# Patient Record
Sex: Male | Born: 1945 | Race: White | Hispanic: No | Marital: Married | Smoking: Never smoker
Health system: Southern US, Community
[De-identification: ages and names within clinical notes are randomized; demographics above are authoritative.]

## PROBLEM LIST (undated history)

## (undated) ENCOUNTER — Emergency Department: Admission: EM | Payer: PRIVATE HEALTH INSURANCE

## (undated) DIAGNOSIS — F5101 Primary insomnia: Secondary | ICD-10-CM

## (undated) DIAGNOSIS — E349 Endocrine disorder, unspecified: Secondary | ICD-10-CM

## (undated) DIAGNOSIS — E7800 Pure hypercholesterolemia, unspecified: Secondary | ICD-10-CM

## (undated) DIAGNOSIS — I639 Cerebral infarction, unspecified: Secondary | ICD-10-CM

## (undated) DIAGNOSIS — F419 Anxiety disorder, unspecified: Secondary | ICD-10-CM

## (undated) DIAGNOSIS — I10 Essential (primary) hypertension: Secondary | ICD-10-CM

## (undated) HISTORY — PX: TRIGGER FINGER RELEASE: SHX641

## (undated) HISTORY — PX: COLONOSCOPY: SHX174

## (undated) HISTORY — PX: HERNIA REPAIR: SHX51

## (undated) HISTORY — PX: CATARACT EXTRACTION: SUR2

## (undated) HISTORY — PX: OTHER SURGICAL HISTORY: SHX169

---

## 2013-10-28 ENCOUNTER — Inpatient Hospital Stay: Payer: Self-pay | Admitting: Internal Medicine

## 2013-10-28 LAB — BASIC METABOLIC PANEL
Calcium, Total: 9.1 mg/dL (ref 8.5–10.1)
Chloride: 109 mmol/L — ABNORMAL HIGH (ref 98–107)
Creatinine: 1.3 mg/dL (ref 0.60–1.30)
EGFR (African American): >60
Glucose: 101 mg/dL — ABNORMAL HIGH (ref 65–99)
Osmolality: 288 (ref 275–301)

## 2013-10-28 LAB — CBC
HGB: 15.1 g/dL (ref 13.0–18.0)
MCH: 33.3 pg (ref 26.0–34.0)
MCHC: 35 g/dL (ref 32.0–36.0)
MCV: 95 fL (ref 80–100)
Platelet: 176 10*3/uL (ref 150–440)
RBC: 4.55 10*6/uL (ref 4.40–5.90)
RDW: 13.5 % (ref 11.5–14.5)
WBC: 5.1 10*3/uL (ref 3.8–10.6)

## 2013-10-28 LAB — TROPONIN I: Troponin-I: <0.02 ng/mL

## 2013-10-29 ENCOUNTER — Ambulatory Visit: Payer: Self-pay | Admitting: Neurology

## 2013-10-29 DIAGNOSIS — I6350 Cerebral infarction due to unspecified occlusion or stenosis of unspecified cerebral artery: Secondary | ICD-10-CM

## 2013-10-29 LAB — COMPREHENSIVE METABOLIC PANEL
Albumin: 3.6 g/dL (ref 3.4–5.0)
Alkaline Phosphatase: 50 U/L
Anion Gap: 6 — ABNORMAL LOW (ref 7–16)
BUN: 22 mg/dL — ABNORMAL HIGH (ref 7–18)
Chloride: 110 mmol/L — ABNORMAL HIGH (ref 98–107)
Co2: 26 mmol/L (ref 21–32)
Creatinine: 1.35 mg/dL — ABNORMAL HIGH (ref 0.60–1.30)
EGFR (Non-African Amer.): 54 — ABNORMAL LOW
Glucose: 108 mg/dL — ABNORMAL HIGH (ref 65–99)
Potassium: 3.8 mmol/L (ref 3.5–5.1)
Sodium: 142 mmol/L (ref 136–145)
Total Protein: 6.7 g/dL (ref 6.4–8.2)

## 2013-10-29 LAB — CBC WITH DIFFERENTIAL/PLATELET
Basophil #: 0 10*3/uL (ref 0.0–0.1)
Basophil %: 1 %
Eosinophil #: 0.1 10*3/uL (ref 0.0–0.7)
Eosinophil %: 2.6 %
HCT: 40.3 % (ref 40.0–52.0)
HGB: 14.1 g/dL (ref 13.0–18.0)
Lymphocyte %: 27.9 %
MCHC: 34.9 g/dL (ref 32.0–36.0)
MCV: 95 fL (ref 80–100)
RBC: 4.26 10*6/uL — ABNORMAL LOW (ref 4.40–5.90)
RDW: 13.4 % (ref 11.5–14.5)
WBC: 4.9 10*3/uL (ref 3.8–10.6)

## 2013-10-29 LAB — TSH: Thyroid Stimulating Horm: 1.86 u[IU]/mL

## 2013-10-29 LAB — LIPID PANEL
Cholesterol: 135 mg/dL (ref 0–200)
HDL Cholesterol: 44 mg/dL (ref 40–60)
Ldl Cholesterol, Calc: 63 mg/dL (ref 0–100)
Triglycerides: 141 mg/dL (ref 0–200)
VLDL Cholesterol, Calc: 28 mg/dL (ref 5–40)

## 2013-10-29 LAB — MAGNESIUM: Magnesium: 2.2 mg/dL

## 2015-03-01 NOTE — H&P (Signed)
**Note Darrell-Identified via Obfuscation** PATIENT NAME:  Darrell Chandler, LESSLEY MR#:  [id number] DATE OF BIRTH:  [birth year]  DATE OF ADMISSION:  10/28/2013  PRIMARY CARE PHYSICIAN: In [place]. He is from [place], [place].  REFERRING PHYSICIAN: Dr. Governor Rooks  CHIEF COMPLAINT: Weakness of left upper extremity.   HISTORY OF PRESENT ILLNESS: This is a very nice, 69 year old gentleman, with history of hypertension, PVCs, palpitations and hyperlipidemia, who comes with weakness of the left upper extremity. The patient woke up at 7:30 a.m. this morning, and he felt weak and numb on his left arm from the shoulder down into the tip of his fingers. Apparently the patient has had issues in the past with neck pain, for what he has been put on Mobic, and he had radial neuropathy, but the symptoms feel completely different to him today. At 9:00 a.m. he went to a baseball game for his grandson, and at that moment he was not able to hold anything with his left hand. The problem continued to worsen up until he arrived to the Emergency Department. In the Emergency Department, the patient was evaluated, and he was admitted for possible TIA versus CVA. After evaluation was completed, the patient also had an MRI of the brain, showing actually acute nonhemorrhagic infarcts involving the posterior right frontal and right parietal cortex, with remote lacunar infarcts in the territory of the mid cerebral artery. Based on my assessment, with his history of PVCs and palpitations and multiple strokes, this could be something related to atrial fibrillation, for what we are going to keep him on telemetry monitoring.   The patient is admitted for treatment and evaluation of this condition. At this moment, he is hemodynamically stable and his symptoms actually have improved, but he still feels significant weakness of his left upper extremity. He has been able to ambulate and not having any balance problems.   REVIEW OF SYSTEMS:   CONSTITUTIONAL: No fever. No fatigue.  EYES: No  blurry vision, double vision.  ENT: No difficulty swallowing. No tinnitus.  RESPIRATORY: No cough or shortness of breath.  CARDIOVASCULAR: No chest pain or edema, since being diagnosed with PVCs, put on a beta blocker, and he also has occasional palpitations.  GASTROINTESTINAL: No nausea, vomiting, abdominal pain, constipation or diarrhea.  GENITOURINARY: No dysuria or hematuria. No prostatitis.  ENDOCRINE: No polyuria, polydipsia, polyphagia, cold or heat intolerance.  HEMATOLOGIC AND LYMPHATIC: No anemia, easy bruising or bleeding.  MUSCULOSKELETAL: No significant joint effusions or gout.  NEUROLOGIC: No numbness, tingling prior to today. Now left upper extremity numbness and weakness. No previous CVAs that he is aware of, although his CT scan shows lacunar infarcts. No previous TIA. No headache.  PSYCHIATRIC: No significant agitation. Positive mild depression. Mild anxiety, as he deals with a lot of problems with his wife who is bipolar, has had  previous suicide attempts, and she is an alcoholic. They have been married for over 45 years.   PAST MEDICAL HISTORY: 1.  Hypertension.  2.  PVCs.  3.  Hyperlipidemia.  The patient has had a stress test within the past couple of months and it was normal.   ALLERGIES: No known drug allergies.   PAST SURGICAL HISTORY: He had knee arthroscopy twice on the right knee.   FAMILY HISTORY: Positive for CVA in both parents. They both died from strokes. No MI and no cancer in the family.   SOCIAL HISTORY: He has never smoked. He drinks one glass of wine a day, no more than that. He never gets drunk.  He is retired, and he is dealing with a lot of stress with his wife due to alcoholism.   CURRENT MEDICATIONS: Include Zantac 75 mg twice daily, Xanax 1 mg or half tablet as needed for anxiety and at night to go to sleep, simvastatin 40 mg once daily, metoprolol 25 mg once daily, meloxicam 50 mg once daily, clonazepam 1 mg once a day at bedtime, amlodipine 5  mg once a day, and Ambien 5 mg once a day.   PHYSICAL EXAMINATION: VITAL SIGNS: Blood pressure 157/50, respirations 18, pulse 74, temperature 98.1, pulse oxygenation 94% at rest.  GENERAL: The patient is alert, oriented x 3, no acute distress. No respiratory distress. Hemodynamically stable.  HEENT: Pupils are equal and reactive. Extraocular movements intact. Mucosa are moist. Anicteric sclerae. Pink conjunctivae. No oral lesions.  NEUROLOGIC: Cranial nerves II through XII intact. Ocular movements intact. Pupils are accommodation and reaction to light is normal. Tongue is central. Uvula is central. Strength is equal in 4 extremities. The patient has 5/5 strength, but he feels like he left arm is heavier.  Sensation is decreased on the left upper extremity from the shoulder down to the tip of the fingers. His strength of both lower extremities is normal. DTRs +2 all over his 4 extremities.  NECK: Supple. No JVD. No thyromegaly. No adenopathy. No carotid bruits. Normal range of motion. No signs of radiculopathy or mobilization of the neck.  CARDIOVASCULAR: Regular rate and rhythm. No murmurs, rubs or gallops are appreciated. No displacement of PMI.  LUNGS: Clear, without any wheezing or crepitus. No use of accessory muscles.  ABDOMEN: Soft, nontender, nondistended. No hepatosplenomegaly. No masses. Bowel sounds are positive.  GENITAL: Exam deferred.  EXTREMITIES: No edema, cyanosis or clubbing.  VASCULAR: Pulses +2. Capillary refill less than 3. SKIN: No rashes or petechiae.  MUSCULOSKELETAL: Normal joints, without any signs of effusions. Normal range of motion.  LYMPHATIC: Negative for lymphadenopathy in the neck or supraclavicular areas.  PSYCHIATRIC: Alert, oriented x 3, good affect.   RESULTS:  MRI of the brain shows acute nonhemorrhagic infarcts involving the posterior right frontal and right parietal cortex, corresponding with left hand symptoms. He has also remote lacunar infarcts in the  basal ganglia bilaterally, and atrophy with chronic microvascular ischemia.   CT scan of the brain:  Old lacunar infarcts. No acute findings. Glucose 101, BUN 20, chloride 109, potassium 3.7. Troponin is negative. White count is 5.1, hemoglobin 15, platelet count 176.  EKG: No ST depression or elevation. No AFib.    ASSESSMENT AND PLAN: This is a very nice, 69 year old gentleman, with history of hypertension, PVCs, and hyperlipidemia, who comes with left upper extremity numbness.  Admitted for treatment of stroke.   1. Cerebrovascular accident. The patient is admitted, put on aspirin, give him atorvastatin instead of simvastatin today at 20 mg. Check lipids. Deep vein thrombosis prophylaxis with heparin at low dose. This is likely to be a stroke secondary to atrial fibrillation. The patient may have telemetry monitoring. I recommend to put on Holter monitoring once he is discharged. Other possibilities: Carotid artery disease, for what he is going to have an ultrasound of the carotid arteries. We are also going to get an echocardiogram to evaluate the possibility of a thromboembolic event. Neuro checks. Neurology reevaluation in the morning. Physical Therapy, Occupational Therapy, and Speech Therapy consulted. The patient is able to swallow at this moment, for what his diet is going to be regular.   2. Hypertension. Holding blood pressure medications  and allow permissive hypertension to give good circulation to the ischemic areas. Treat blood pressures systolics only if they are above 200, diastolics only above 110.   3.  Elevation of BUN and creatinine. This could be secondary to slight dehydration. Recommend just normal amount of water drinking. I don't have a previous creatinine to compare. Recheck in the morning.   4.  Hyperlipidemia. Now on atorvastatin, as it is more powerful than simvastatin. Check a lipid profile in the morning.   5. Check other risk factors, as hemoglobin A1c and TSH.   6.   For his alcohol consumption, at this moment I think it is okay just to observe him. He only drinks once a night, one single drink. If any signs of DTs, consider CIWA protocol.   7.  Gastrointestinal prophylaxis with proton pump inhibitor, deep vein thrombosis prophylaxis with heparin.  I spent about 50 minutes with this patient.      ____________________________ Felipa Furnace, MD rsg:mr D: 10/28/2013 22:09:32 ET T: 10/28/2013 22:35:59 ET JOB#: [id number]  cc: Felipa Furnace, MD, <Dictator> Brianna Esson Juanda Chance MD ELECTRONICALLY SIGNED 10/30/2013 15:02

## 2015-03-02 NOTE — Discharge Summary (Signed)
**Note Darrell-Identified via Obfuscation** PATIENT NAME:  Darrell Chandler, Darrell Chandler MR#:  [id number] DATE OF BIRTH:  11-13-1945  DATE OF ADMISSION:  10/28/2013 DATE OF DISCHARGE:  10/30/2013  ADMITTING PHYSICIAN: Darrell Sink, MD  DISCHARGING PHYSICIAN: Darrell Lighter, MD  PRIMARY CARE PHYSICIAN: Darrell Chandler, in [place].  CONSULTATIONS IN THE HOSPITAL: Neurology by Darrell Chandler.   DISCHARGE DIAGNOSES: 1.  Acute cerebral vascular accident with posterior right frontal and parietal infarcts with left hand numbness which is improving.  2.  History of palpitations and premature ventricular complexes. No atrial fibrillation noted on telemetry.  3.  Hypertension.  4.  Hyperlipidemia.  5.  Cervical radiculopathy.  DISCHARGE HOME MEDICATIONS:  1.  Meloxicam 15 mg p.o. daily. 2.  Xanax 0.5 mg p.r.n. for anxiety.  3.  Clonazepam 1 mg daily at bedtime.  4.  Ambien 5 mg at bedtime.  5.  Zantac 75 mg 4 tablets once a day.  6.  Zyrtec 10 mg p.o. daily.  7.  Simvastatin 40 mg p.o. daily.  8.  Norvasc 2.5 mg p.o. daily.  9.  Metoprolol 25 mg at bedtime.  10.  Aggrenox 1 capsule p.o. b.i.d.   DISCHARGE DIET: Low sodium.  DISCHARGE ACTIVITY: As tolerated.  FOLLOWUP INSTRUCTIONS: 1.  Cardiology follow-up in 2 weeks for loop recorder to see if atrial fibrillation can be diagnosed or ruled out, especially in light of the new CVA. 2.  Also check with the cardiologist to see if TEE can be done to rule out patent foramen ovale or any cardiac source of emboli as TTE was normal. 3.  Primary care physician followup in 1 to 2 weeks.  LABS AND IMAGING STUDIES PRIOR TO DISCHARGE: WBC 4.9, hemoglobin 14.1, hematocrit 40.3, and platelet count 166.   Sodium 142, potassium 3.8, chloride 110, bicarb 26, BUN 22, creatinine 1.35, glucose 108, and calcium of 8.9.   ALT 25, AST 31, alk phos 50, total bili 0.6, and albumin 3.6. Magnesium 2.2. HbA1c is 5.3. 4.  LDL cholesterol 63, HDL 44, total cholesterol is 135, and triglycerides 141. TSH within normal  limits at 1.86.  MRI of brain showing acute nonhemorrhagic infarction involving the posterior right frontal and right parietal cortex. Remote lacunar infarcts of the basal ganglia seen. Atrophy and advanced white matter disease likely representing chronic microvascular ischemia.   Carotid Dopplers bilaterally showing no significant stenosis.   Transthoracic echocardiogram revealing EF of 60% to 65%. Normal global LV systolic function.  Cardiac enzymes are negative.   CT of the head showing no evidence of any acute intracranial abnormality, chronic left thalamic infarct seen.   BRIEF HOSPITAL COURSE: Darrell Chandler is a 69 year old very pleasant Caucasian gentleman with past medical history significant for hypertension, hyperlipidemia, and history of PVCs with no diagnosis of A-fib or flutter who is visiting his family from [place] here for Christmas who presents to the hospital secondary to sudden onset of left hand numbness and weakness secondary to loss of sensation.  1.  Acute CVA. The patient does have history of cervical radiculopathy with left hand symptoms, but mostly involving the ulnar distribution. However, this numbness was different so the patient presented to the hospital and MRI did reveal that he had right-sided infarcts which are consistent with the left right-sided symptoms. The patient's paresthesias have improved significantly throughout the hospital course. He was on aspirin at home. That was changed over to Aggrenox. He is already taking statin and his cholesterol was within normal limits. He had a transthoracic echo which did not show  any cardiac source of emboli. He does have known history of PVCs, but he was on the monitor for almost 48 hours which does not show any known diagnosis of A-fib. He was seen by neurology who recommended he will need a TEE and loop recorder for diagnosis of A-fib or finding out other cardiac source of emboli. However, the patient prefers to have this done  as an outpatient because he will be flying back to [place] in 2 days and has his primary cardiologist over there. Checked with neurologist, Darrell Chandler, and also cardiologist, Darrell Chandler, who are agreeable and the risk of recurrent stroke in the meanwhile would be extremely less, although still possible. The patient worked with physical therapy who recommended that he will not be needing any home health or rehab as he is doing extremely well at this time.   All his other home medications were continued without any changes.   DISCHARGE CONDITION: Stable.   DISCHARGE DISPOSITION: Home.   TIME SPENT ON DISCHARGE: 40 minutes.  ____________________________ Darrell Lighter, MD rk:sb D: 10/30/2013 14:38:52 ET T: 10/30/2013 16:17:30 ET JOB#: [id number]  cc: Darrell Lighter, MD, <Dictator> Darrell Lighter MD ELECTRONICALLY SIGNED 11/15/2013 7:20

## 2015-03-02 NOTE — Consult Note (Signed)
PATIENT NAME:  Darrell Chandler, Darrell Chandler MR#:  [id number] DATE OF BIRTH:  September 06, 1946  DATE OF CONSULTATION:  10/29/2013  REFERRING PHYSICIAN:  Internal medicine hospitalist CONSULTING PHYSICIAN:  Weston Settle, MD  REASON FOR CONSULTATION:  Stroke.   HISTORY OF PRESENT ILLNESS: The patient is a 69 year old white male who has moved here from [place]. This morning, he got up and was noticing after he woke up that he had some left hand weakness and numbness and that he could not hold onto things or use his left hand well. There was no left face or leg involvement. Upon arrival, he had a CT scan of the brain which I reviewed personally.  It indicates no acute hemorrhages. There is evidence of an old left thalamic infarct. Subsequent MRI of the brain was performed which I reviewed personally. It indicates 2 acute ischemic infarcts. The first one is in the right frontal motor cortex inferiorly and second one is in the right parietal somatosensory cortex. There is an old left thalamic infarct. There is an old right cerebellar infarct as well. Carotid Doppler ultrasound is normal. Transthoracic echocardiogram is pending at this time. Telemetry shows essentially normal sinus rhythm with very frequent PVCs. This is something that he says that he has been told he has in the past and had a long Holter monitoring in [place] without any evidence of atrial fibrillation that they could ascertain at that time. He had not been taking any antiplatelets before he came in and was put on aspirin here. CBC and complete metabolic panel are normal. LDL 63, triglycerides are 284. Hemoglobin A1c is 5.3. Troponin I is negative. TSH is 1.86.   PAST MEDICAL HISTORY.  1.  Premature ventricular contractions.  2.  Ischemic stroke.  3.  Hypertension.  4.  Hyperlipidemia.   PAST SURGICAL HISTORY: Knee arthroscopy twice in the right knee.   CURRENT MEDICATIONS IN THE HOSPITAL: 1.  Aspirin 325 mg daily.  2.  Lipitor 20 mg daily.  3.   Clonazepam 1 mg daily.  4.  Heparin 5000 units q.12 hours.   ALLERGIES:  No known drug allergies.   SOCIAL HISTORY: He does not smoke or use any illicit drugs and he is a social alcohol drinker once a day.   FAMILY HISTORY: Positive for stroke in both parents.   REVIEW OF SYSTEMS: Reveals no fever, no meningismus, no diplopia, no dysphagia, no chest pain or shortness of breath and all other review of systems are negative.   PHYSICAL EXAMINATION:  VITAL SIGNS:  Blood pressure is 136/81, pulse of 76, temperature 97.8.  HEART: Regular rate and rhythm S1, S2, no murmurs.  LUNGS: Are clear.  NECK:  There are no carotid bruits.  NEUROLOGICAL EXAMINATION:  He is awake and alert. Language is fluent. Comprehension, naming and repetition are intact. Pupils are equal and reactive. Extraocular movements are intact. Face is symmetrical. Tongue is midline. Strength testing reveals about 4 out of 5 strength in the left finger abduction and grip, and about 4 out of 5 strength in wrist extension on the left. Everywhere else is 5 out of 5 in strength. Coordination with finger-to-nose and heel-to-shin is intact and symmetrical. Sensory testing reveals slightly reduced pinprick sensation in the left hand. Gait testing was deferred at this time.   IMPRESSION AND PLAN: This patient has suffered 2 ischemic infarcts causing mainly left hand weakness and numbness. Because of the 2 isolated infarcts acutely, I suspect this is probably cardiac embolism. Atrial fibrillation, unfortunately, has never  been fully officially documented in this patient despite Holter monitoring in the past. He may require an implanted loop recorder which can stay for up to 3 years to assess for that. This will be important since if we do confirm that, then anticoagulation is the treatment of choice to reduce future stroke risk. For now, we can continue aspirin and continue telemetry monitoring. If the transthoracic echocardiogram does not show  intracardiac thrombus or any other source of possible intracardiac thrombus, then I do recommend doing a transesophageal echocardiogram as well to look for a patent foramen ovale with atrioseptal aneurysm as well as possible atrial myxoma as well as atrial appendage thrombus or any other intracardiac source as well as aortic arch atheroma which could be a source of embolism. Further management will depend on the results of these studies.   ____________________________ Weston SettleShervin Lilia Letterman, MD se:cs D: 10/29/2013 12:35:15 ET T: 10/29/2013 15:48:07 ET JOB#: [id number]  cc: Weston SettleShervin Marilyne Haseley, MD, <Dictator> Weston SettleSHERVIN Jaysean Manville MD ELECTRONICALLY SIGNED 12/02/2013 14:42

## 2017-01-06 DIAGNOSIS — M1991 Primary osteoarthritis, unspecified site: Secondary | ICD-10-CM | POA: Insufficient documentation

## 2017-01-06 DIAGNOSIS — F5101 Primary insomnia: Secondary | ICD-10-CM | POA: Insufficient documentation

## 2017-01-06 DIAGNOSIS — N183 Chronic kidney disease, stage 3 unspecified: Secondary | ICD-10-CM | POA: Insufficient documentation

## 2017-01-06 DIAGNOSIS — Z8673 Personal history of transient ischemic attack (TIA), and cerebral infarction without residual deficits: Secondary | ICD-10-CM | POA: Insufficient documentation

## 2017-01-06 DIAGNOSIS — Z0000 Encounter for general adult medical examination without abnormal findings: Secondary | ICD-10-CM | POA: Insufficient documentation

## 2017-01-06 DIAGNOSIS — I129 Hypertensive chronic kidney disease with stage 1 through stage 4 chronic kidney disease, or unspecified chronic kidney disease: Secondary | ICD-10-CM | POA: Insufficient documentation

## 2017-01-06 DIAGNOSIS — E7800 Pure hypercholesterolemia, unspecified: Secondary | ICD-10-CM | POA: Insufficient documentation

## 2017-01-06 DIAGNOSIS — F411 Generalized anxiety disorder: Secondary | ICD-10-CM | POA: Insufficient documentation

## 2017-07-14 DIAGNOSIS — I493 Ventricular premature depolarization: Secondary | ICD-10-CM | POA: Insufficient documentation

## 2017-11-16 ENCOUNTER — Other Ambulatory Visit: Payer: Self-pay | Admitting: Neurology

## 2017-11-16 DIAGNOSIS — I639 Cerebral infarction, unspecified: Secondary | ICD-10-CM

## 2017-11-17 ENCOUNTER — Ambulatory Visit
Admission: RE | Admit: 2017-11-17 | Discharge: 2017-11-17 | Disposition: A | Discharge status: Home / Self Care | Payer: Medicare Other | Source: Ambulatory Visit | Attending: Neurology | Admitting: Neurology

## 2017-11-17 DIAGNOSIS — Z9189 Other specified personal risk factors, not elsewhere classified: Secondary | ICD-10-CM | POA: Diagnosis not present

## 2017-11-17 DIAGNOSIS — I6782 Cerebral ischemia: Secondary | ICD-10-CM | POA: Insufficient documentation

## 2017-11-17 DIAGNOSIS — I639 Cerebral infarction, unspecified: Secondary | ICD-10-CM | POA: Diagnosis present

## 2017-11-29 DIAGNOSIS — E538 Deficiency of other specified B group vitamins: Secondary | ICD-10-CM | POA: Insufficient documentation

## 2018-01-06 DIAGNOSIS — M545 Low back pain, unspecified: Secondary | ICD-10-CM | POA: Insufficient documentation

## 2018-01-06 DIAGNOSIS — G8929 Other chronic pain: Secondary | ICD-10-CM | POA: Insufficient documentation

## 2018-05-23 DIAGNOSIS — R42 Dizziness and giddiness: Secondary | ICD-10-CM | POA: Insufficient documentation

## 2019-06-02 ENCOUNTER — Inpatient Hospital Stay: Admission: RE | Admit: 2019-06-02 | Payer: PRIVATE HEALTH INSURANCE | Source: Ambulatory Visit

## 2019-06-02 ENCOUNTER — Other Ambulatory Visit: Payer: Self-pay

## 2019-06-02 ENCOUNTER — Other Ambulatory Visit
Admission: RE | Admit: 2019-06-02 | Discharge: 2019-06-02 | Disposition: A | Discharge status: Home / Self Care | Payer: Medicare Other | Source: Ambulatory Visit | Attending: Internal Medicine | Admitting: Internal Medicine

## 2019-06-02 DIAGNOSIS — Z1159 Encounter for screening for other viral diseases: Secondary | ICD-10-CM | POA: Insufficient documentation

## 2019-06-03 LAB — SARS CORONAVIRUS 2 (TAT 6-24 HRS): SARS Coronavirus 2: NEGATIVE

## 2019-06-06 ENCOUNTER — Ambulatory Visit: Payer: Medicare Other | Admitting: Anesthesiology

## 2019-06-06 ENCOUNTER — Other Ambulatory Visit: Payer: Self-pay

## 2019-06-06 ENCOUNTER — Ambulatory Visit
Admission: RE | Admit: 2019-06-06 | Discharge: 2019-06-06 | Disposition: A | Discharge status: Home / Self Care | Payer: Medicare Other | Attending: Internal Medicine | Admitting: Internal Medicine

## 2019-06-06 ENCOUNTER — Encounter
Admission: RE | Disposition: A | Discharge status: Home / Self Care | Payer: Self-pay | Source: Home / Self Care | Attending: Internal Medicine

## 2019-06-06 DIAGNOSIS — Z79899 Other long term (current) drug therapy: Secondary | ICD-10-CM | POA: Insufficient documentation

## 2019-06-06 DIAGNOSIS — R1312 Dysphagia, oropharyngeal phase: Secondary | ICD-10-CM | POA: Insufficient documentation

## 2019-06-06 DIAGNOSIS — J45909 Unspecified asthma, uncomplicated: Secondary | ICD-10-CM | POA: Diagnosis not present

## 2019-06-06 DIAGNOSIS — K449 Diaphragmatic hernia without obstruction or gangrene: Secondary | ICD-10-CM | POA: Diagnosis not present

## 2019-06-06 DIAGNOSIS — F5101 Primary insomnia: Secondary | ICD-10-CM | POA: Insufficient documentation

## 2019-06-06 DIAGNOSIS — R1310 Dysphagia, unspecified: Secondary | ICD-10-CM | POA: Diagnosis present

## 2019-06-06 DIAGNOSIS — Z7982 Long term (current) use of aspirin: Secondary | ICD-10-CM | POA: Insufficient documentation

## 2019-06-06 DIAGNOSIS — E291 Testicular hypofunction: Secondary | ICD-10-CM | POA: Insufficient documentation

## 2019-06-06 DIAGNOSIS — K222 Esophageal obstruction: Secondary | ICD-10-CM | POA: Insufficient documentation

## 2019-06-06 DIAGNOSIS — F419 Anxiety disorder, unspecified: Secondary | ICD-10-CM | POA: Insufficient documentation

## 2019-06-06 DIAGNOSIS — I10 Essential (primary) hypertension: Secondary | ICD-10-CM | POA: Diagnosis not present

## 2019-06-06 DIAGNOSIS — K209 Esophagitis, unspecified: Secondary | ICD-10-CM | POA: Insufficient documentation

## 2019-06-06 DIAGNOSIS — K228 Other specified diseases of esophagus: Secondary | ICD-10-CM | POA: Diagnosis not present

## 2019-06-06 DIAGNOSIS — E7800 Pure hypercholesterolemia, unspecified: Secondary | ICD-10-CM | POA: Diagnosis not present

## 2019-06-06 DIAGNOSIS — Z8673 Personal history of transient ischemic attack (TIA), and cerebral infarction without residual deficits: Secondary | ICD-10-CM | POA: Insufficient documentation

## 2019-06-06 HISTORY — DX: Pure hypercholesterolemia, unspecified: E78.00

## 2019-06-06 HISTORY — DX: Endocrine disorder, unspecified: E34.9

## 2019-06-06 HISTORY — PX: ESOPHAGOGASTRODUODENOSCOPY: SHX5428

## 2019-06-06 HISTORY — DX: Essential (primary) hypertension: I10

## 2019-06-06 HISTORY — DX: Cerebral infarction, unspecified: I63.9

## 2019-06-06 HISTORY — DX: Anxiety disorder, unspecified: F41.9

## 2019-06-06 HISTORY — DX: Primary insomnia: F51.01

## 2019-06-06 SURGERY — EGD (ESOPHAGOGASTRODUODENOSCOPY)
Anesthesia: General

## 2019-06-06 MED ORDER — SODIUM CHLORIDE 0.9 % IV SOLN
INTRAVENOUS | Status: DC
  Administered 2019-06-06: 10:00:00 via INTRAVENOUS

## 2019-06-06 MED ORDER — LIDOCAINE HCL (CARDIAC) PF 100 MG/5ML IV SOSY
PREFILLED_SYRINGE | INTRAVENOUS | Status: DC | PRN
  Administered 2019-06-06: 100 mg via INTRAVENOUS

## 2019-06-06 MED ORDER — PROPOFOL 10 MG/ML IV BOLUS
INTRAVENOUS | Status: DC | PRN
  Administered 2019-06-06: 80 mg via INTRAVENOUS

## 2019-06-06 MED ORDER — PROPOFOL 500 MG/50ML IV EMUL
INTRAVENOUS | Status: DC | PRN
  Administered 2019-06-06: 180 ug/kg/min via INTRAVENOUS

## 2019-06-06 MED ORDER — LIDOCAINE HCL (PF) 2 % IJ SOLN
INTRAMUSCULAR | Status: AC
  Filled 2019-06-06: qty 10

## 2019-06-06 MED ORDER — PROPOFOL 500 MG/50ML IV EMUL
INTRAVENOUS | Status: AC
  Filled 2019-06-06: qty 50

## 2019-06-06 NOTE — Transfer of Care (Signed)
Immediate Anesthesia Transfer of Care Note  Patient: Darrell Chandler  Procedure(s) Performed: ESOPHAGOGASTRODUODENOSCOPY (EGD) (N/A )  Patient Location: PACU  Anesthesia Type:General  Level of Consciousness: sedated  Airway & Oxygen Therapy: Patient Spontanous Breathing and Patient connected to nasal cannula oxygen  Post-op Assessment: Report given to RN and Post -op Vital signs reviewed and stable  Post vital signs: Reviewed and stable  Last Vitals:  Vitals Value Taken Time  BP 113/63 06/06/19 1121  Temp 36.8 C 06/06/19 1121  Pulse 60 06/06/19 1122  Resp 19 06/06/19 1122  SpO2 97 % 06/06/19 1122  Vitals shown include unvalidated device data.  Last Pain:  Vitals:   06/06/19 1121  TempSrc: Tympanic  PainSc: Asleep         Complications: No apparent anesthesia complications

## 2019-06-06 NOTE — Anesthesia Preprocedure Evaluation (Signed)
Anesthesia Evaluation  Patient identified by MRN, date of birth, ID band Patient awake    Reviewed: Allergy & Precautions, NPO status , Patient's Chart, lab work & pertinent test results, reviewed documented beta blocker date and time   History of Anesthesia Complications Negative for: history of anesthetic complications  Airway Mallampati: II       Dental   Pulmonary asthma , neg sleep apnea, neg COPD,           Cardiovascular hypertension, Pt. on medications and Pt. on home beta blockers (-) Past MI and (-) CHF (-) dysrhythmias (-) Valvular Problems/Murmurs     Neuro/Psych neg Seizures Anxiety CVA (L hand weakness, resolved), No Residual Symptoms    GI/Hepatic Neg liver ROS, GERD  Medicated,  Endo/Other  neg diabetes  Renal/GU negative Renal ROS     Musculoskeletal   Abdominal   Peds  Hematology   Anesthesia Other Findings   Reproductive/Obstetrics                             Anesthesia Physical Anesthesia Plan  ASA: III  Anesthesia Plan: General   Post-op Pain Management:    Induction: Intravenous  PONV Risk Score and Plan: 2 and TIVA and Propofol infusion  Airway Management Planned: Nasal Cannula  Additional Equipment:   Intra-op Plan:   Post-operative Plan:   Informed Consent: I have reviewed the patients History and Physical, chart, labs and discussed the procedure including the risks, benefits and alternatives for the proposed anesthesia with the patient or authorized representative who has indicated his/her understanding and acceptance.       Plan Discussed with:   Anesthesia Plan Comments:         Anesthesia Quick Evaluation

## 2019-06-06 NOTE — Interval H&P Note (Signed)
History and Physical Interval Note:  06/06/2019 11:04 AM  Darrell Chandler  has presented today for surgery, with the diagnosis of Dysphagia.  The various methods of treatment have been discussed with the patient and family. After consideration of risks, benefits and other options for treatment, the patient has consented to  Procedure(s): ESOPHAGOGASTRODUODENOSCOPY (EGD) (N/A) as a surgical intervention.  The patient's history has been reviewed, patient examined, no change in status, stable for surgery.  I have reviewed the patient's chart and labs.  Questions were answered to the patient's satisfaction.     [place], [place]

## 2019-06-06 NOTE — H&P (Signed)
Outpatient short stay form Pre-procedure 06/06/2019 11:02 AM Darrell Chandler, M.D.  Primary Physician:  [name], M.D.  Reason for visit:  Dysphagia  History of present illness:  Oropharyngeal dyshpagia to solid, dry foods. Some GERD symptoms on Protonix. Mild postprandial abdominal pain. No hemetemesis or melena. No weight loss. Takes aggrenox. Held today.     Current Facility-Administered Medications:  .  0.9 %  sodium chloride infusion, , Intravenous, Continuous, [place], Benay Pike, MD, Last Rate: 20 mL/hr at 06/06/19 1012  Medications Prior to Admission  Medication Sig Dispense Refill Last Dose  . amLODipine (NORVASC) 5 MG tablet Take 5 mg by mouth daily.   06/05/2019 at Unknown time  . atorvastatin (LIPITOR) 40 MG tablet Take 40 mg by mouth daily.   06/05/2019 at Unknown time  . CHOLECALCIFEROL PO Take 5,000 Units by mouth daily.   06/05/2019 at Unknown time  . clonazePAM (KLONOPIN) 1 MG tablet Take 1 mg by mouth 2 (two) times daily.   06/05/2019 at Unknown time  . cyanocobalamin 1000 MCG tablet Take 1,000 mcg by mouth daily.   06/05/2019 at Unknown time  . cyclobenzaprine (FLEXERIL) 10 MG tablet Take 5-10 mg by mouth 2 (two) times daily as needed for muscle spasms.   Past Month at Unknown time  . dipyridamole-aspirin (AGGRENOX) 200-25 MG 12hr capsule Take 1 capsule by mouth 2 (two) times daily.   06/05/2019 at Unknown time  . metoprolol succinate (TOPROL-XL) 25 MG 24 hr tablet Take 25 mg by mouth daily.   06/05/2019 at Unknown time  . pantoprazole (PROTONIX) 40 MG tablet Take 40 mg by mouth daily.   06/05/2019 at Unknown time  . Probiotic Product (ACIDOPHILUS PROBIOTIC BLEND PO) Take by mouth daily.   06/05/2019 at Unknown time  . zolpidem (AMBIEN) 5 MG tablet Take 5 mg by mouth at bedtime as needed for sleep.   06/05/2019 at Unknown time     Not on File   Past Medical History:  Diagnosis Date  . Anxiety   . 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 Hypercholesterolemia   .  Hypertension   . Hypotestosteronemia   . Primary insomnia     Review of systems:  Otherwise negative.    Physical Exam  Gen: Alert, oriented. Appears stated age.  HEENT: [place]/AT. PERRLA. Lungs: CTA, no wheezes. CV: RR nl S1, S2. Abd: soft, benign, no masses. BS+ Ext: No edema. Pulses 2+    Planned procedures: Proceed with EGD The patient understands the nature of the planned procedure, indications, risks, alternatives and potential complications including but not limited to bleeding, infection, perforation, damage to internal organs and possible oversedation/side effects from anesthesia. The patient agrees and gives consent to proceed.  Please refer to procedure notes for findings, recommendations and patient disposition/instructions.     Darrell Chandler, M.D. Gastroenterology 06/06/2019  11:02 AM

## 2019-06-06 NOTE — Anesthesia Procedure Notes (Signed)
Date/Time: 06/06/2019 11:00 AM Performed by: Allean Found, CRNA Pre-anesthesia Checklist: Patient identified, Emergency Drugs available, Suction available, Patient being monitored and Timeout performed Patient Re-evaluated:Patient Re-evaluated prior to induction Oxygen Delivery Method: Nasal cannula Placement Confirmation: positive ETCO2

## 2019-06-06 NOTE — Op Note (Signed)
Darrell Chandler Patient Name: Darrell StallionHenry De Chandler Procedure Date: 06/06/2019 10:48 AM MRN: [id number] Account #: [id number] Date of Birth: 10/23/1946 Admit Type: Outpatient Age: [age] [facility] Gender: Male Note Status: Finalized Procedure:            Upper GI endoscopy Indications:          Dysphagia, Suspected esophageal reflux Providers:            Boykin Nearingeodoro K. Kathyjo Briere MD, MD Medicines:            Propofol per Anesthesia Complications:        No immediate complications. Procedure:            Pre-Anesthesia Assessment:                       - The risks and benefits of the procedure and the                        sedation options and risks were discussed with the                        patient. All questions were answered and informed                        consent was obtained.                       - Patient identification and proposed procedure were                        verified prior to the procedure by the nurse. The                        procedure was verified in the procedure room.                       - ASA Grade Assessment: III - A patient with severe                        systemic disease.                       - After reviewing the risks and benefits, the patient                        was deemed in satisfactory condition to undergo the                        procedure.                       After obtaining informed consent, the endoscope was                        passed under direct vision. Throughout the procedure,                        the patient's blood pressure, pulse, and oxygen                        saturations were monitored continuously. The Endoscope  was introduced through the mouth, and advanced to the                        third part of duodenum. The upper GI endoscopy was                        accomplished without difficulty. The patient tolerated                        the procedure  well. Findings:      Mucosal changes including longitudinal furrows, white plaques and crepe       paper esophagus were found in the entire esophagus. Esophageal findings       were graded using the Eosinophilic Esophagitis Endoscopic Reference       Score (EoE-EREFS) as: Edema Grade 0 Normal (distinct vascular markings),       Rings Grade 0 None (no ridges or rings seen), Exudates Grade 1 Mild       (scattered white lesions involving less than 10 percent of the       esophageal surface area), Furrows Grade 1 Present (vertical lines with       or without visible depth) and Stricture present. Biopsies were obtained       from the proximal and distal esophagus with cold forceps for histology       of suspected eosinophilic esophagitis.      One benign-appearing, intrinsic moderate stenosis was found in the       distal esophagus. This stenosis measured 1.4 cm (inner diameter) x less       than one cm (in length). The stenosis was traversed. The scope was       withdrawn. Dilation was performed with a Maloney dilator with moderate       resistance at 54 Fr.      A 2 cm hiatal hernia was present.      The examined duodenum was normal.      The exam was otherwise without abnormality. Impression:           - Esophageal mucosal changes suspicious for                        eosinophilic esophagitis. Biopsied.                       - Benign-appearing esophageal stenosis. Dilated.                       - 2 cm hiatal hernia.                       - Normal examined duodenum.                       - The examination was otherwise normal. Recommendation:       - Patient has a contact number available for                        emergencies. The signs and symptoms of potential                        delayed complications were discussed with the patient.  Return to normal activities tomorrow. Written discharge                        instructions were provided to the patient.                        - Resume previous diet.                       - Continue present medications.                       - Await pathology results.                       - Return to physician assistant in 2 weeks.                       - You will be seen by Octavia Bruckner, PA-C for your                        follow up visit in the office. Procedure Code(s):    --- Professional ---                       [phone], Esophagogastroduodenoscopy, flexible, transoral;                        with biopsy, single or multiple                       43450, Dilation of esophagus, by unguided sound or                        bougie, single or multiple passes Diagnosis Code(s):    --- Professional ---                       R13.10, Dysphagia, unspecified                       K44.9, Diaphragmatic hernia without obstruction or                        gangrene                       K22.2, Esophageal obstruction                       K22.8, Other specified diseases of esophagus CPT copyright 2019 American Medical Association. All rights reserved. The codes documented in this report are preliminary and upon coder review may  be revised to meet current compliance requirements. Efrain Sella MD, MD 06/06/2019 11:15:49 AM This report has been signed electronically. Number of Addenda: 0 Note Initiated On: 06/06/2019 10:48 AM Estimated Blood Loss: Estimated blood loss was minimal.      [facility]

## 2019-06-06 NOTE — Anesthesia Post-op Follow-up Note (Signed)
Anesthesia QCDR form completed.        

## 2019-06-06 NOTE — Anesthesia Postprocedure Evaluation (Signed)
Anesthesia Post Note  Patient: Darrell Chandler  Procedure(s) Performed: ESOPHAGOGASTRODUODENOSCOPY (EGD) (N/A )  Patient location during evaluation: Endoscopy Anesthesia Type: General Level of consciousness: awake and alert Pain management: pain level controlled Vital Signs Assessment: post-procedure vital signs reviewed and stable Respiratory status: spontaneous breathing and respiratory function stable Cardiovascular status: stable Anesthetic complications: no     Last Vitals:  Vitals:   06/06/19 1008 06/06/19 1121  BP: 123/88 113/63  Pulse: 76   Resp: 20   Temp: 36.9 C 36.8 C  SpO2: 92%     Last Pain:  Vitals:   06/06/19 1131  TempSrc:   PainSc: 0-No pain                 KEPHART,WILLIAM K

## 2019-06-07 ENCOUNTER — Encounter: Payer: Self-pay | Admitting: Internal Medicine

## 2019-06-07 LAB — SURGICAL PATHOLOGY

## 2019-11-21 DIAGNOSIS — R7303 Prediabetes: Secondary | ICD-10-CM | POA: Insufficient documentation

## 2020-02-29 DIAGNOSIS — Q211 Atrial septal defect: Secondary | ICD-10-CM | POA: Insufficient documentation

## 2020-02-29 DIAGNOSIS — Q2112 Patent foramen ovale: Secondary | ICD-10-CM | POA: Insufficient documentation

## 2021-01-04 ENCOUNTER — Observation Stay
Admission: EM | Admit: 2021-01-04 | Discharge: 2021-01-05 | Disposition: A | Discharge status: Home / Self Care | Payer: Medicare Other | Attending: Internal Medicine | Admitting: Internal Medicine

## 2021-01-04 ENCOUNTER — Other Ambulatory Visit: Payer: Self-pay

## 2021-01-04 ENCOUNTER — Emergency Department: Payer: Medicare Other

## 2021-01-04 ENCOUNTER — Observation Stay: Payer: Medicare Other

## 2021-01-04 DIAGNOSIS — Z7982 Long term (current) use of aspirin: Secondary | ICD-10-CM | POA: Diagnosis not present

## 2021-01-04 DIAGNOSIS — Z79899 Other long term (current) drug therapy: Secondary | ICD-10-CM | POA: Diagnosis not present

## 2021-01-04 DIAGNOSIS — Z20822 Contact with and (suspected) exposure to covid-19: Secondary | ICD-10-CM | POA: Insufficient documentation

## 2021-01-04 DIAGNOSIS — G459 Transient cerebral ischemic attack, unspecified: Secondary | ICD-10-CM | POA: Diagnosis not present

## 2021-01-04 DIAGNOSIS — Z8673 Personal history of transient ischemic attack (TIA), and cerebral infarction without residual deficits: Secondary | ICD-10-CM | POA: Diagnosis not present

## 2021-01-04 DIAGNOSIS — E782 Mixed hyperlipidemia: Secondary | ICD-10-CM | POA: Diagnosis not present

## 2021-01-04 DIAGNOSIS — I639 Cerebral infarction, unspecified: Secondary | ICD-10-CM

## 2021-01-04 DIAGNOSIS — R200 Anesthesia of skin: Secondary | ICD-10-CM | POA: Diagnosis present

## 2021-01-04 DIAGNOSIS — I10 Essential (primary) hypertension: Secondary | ICD-10-CM

## 2021-01-04 LAB — DIFFERENTIAL
Abs Immature Granulocytes: 0.02 10*3/uL (ref 0.00–0.07)
Basophils Absolute: 0 10*3/uL (ref 0.0–0.1)
Basophils Relative: 1 %
Eosinophils Absolute: 0.1 10*3/uL (ref 0.0–0.5)
Eosinophils Relative: 2 %
Immature Granulocytes: 0 %
Lymphocytes Relative: 31 %
Lymphs Abs: 2.2 10*3/uL (ref 0.7–4.0)
Monocytes Absolute: 0.6 10*3/uL (ref 0.1–1.0)
Monocytes Relative: 9 %
Neutro Abs: 4 10*3/uL (ref 1.7–7.7)
Neutrophils Relative %: 57 %

## 2021-01-04 LAB — COMPREHENSIVE METABOLIC PANEL
ALT: 16 U/L (ref 0–44)
AST: 24 U/L (ref 15–41)
Albumin: 4.3 g/dL (ref 3.5–5.0)
Alkaline Phosphatase: 49 U/L (ref 38–126)
Anion gap: 10 (ref 5–15)
BUN: 18 mg/dL (ref 8–23)
CO2: 23 mmol/L (ref 22–32)
Calcium: 9.5 mg/dL (ref 8.9–10.3)
Chloride: 105 mmol/L (ref 98–111)
Creatinine, Ser: 1.31 mg/dL — ABNORMAL HIGH (ref 0.61–1.24)
GFR, Estimated: 57 mL/min — ABNORMAL LOW (ref >60)
Glucose, Bld: 120 mg/dL — ABNORMAL HIGH (ref 70–99)
Potassium: 3.7 mmol/L (ref 3.5–5.1)
Sodium: 138 mmol/L (ref 135–145)
Total Bilirubin: 1 mg/dL (ref 0.3–1.2)
Total Protein: 7.5 g/dL (ref 6.5–8.1)

## 2021-01-04 LAB — CBC
HCT: 42.5 % (ref 39.0–52.0)
Hemoglobin: 14.8 g/dL (ref 13.0–17.0)
MCH: 34.2 pg — ABNORMAL HIGH (ref 26.0–34.0)
MCHC: 34.8 g/dL (ref 30.0–36.0)
MCV: 98.2 fL (ref 80.0–100.0)
Platelets: 201 10*3/uL (ref 150–400)
RBC: 4.33 MIL/uL (ref 4.22–5.81)
RDW: 12.7 % (ref 11.5–15.5)
WBC: 7 10*3/uL (ref 4.0–10.5)
nRBC: 0 % (ref 0.0–0.2)

## 2021-01-04 LAB — CBG MONITORING, ED: Glucose-Capillary: 118 mg/dL — ABNORMAL HIGH (ref 70–99)

## 2021-01-04 LAB — PROTIME-INR
INR: 1 (ref 0.8–1.2)
Prothrombin Time: 12.5 seconds (ref 11.4–15.2)

## 2021-01-04 LAB — APTT: aPTT: 26 seconds (ref 24–36)

## 2021-01-04 MED ORDER — ACETAMINOPHEN 650 MG RE SUPP: 650.0000 mg | RECTAL | Status: DC | PRN

## 2021-01-04 MED ORDER — ACETAMINOPHEN 325 MG PO TABS: 650.0000 mg | ORAL_TABLET | ORAL | Status: DC | PRN

## 2021-01-04 MED ORDER — SENNOSIDES-DOCUSATE SODIUM 8.6-50 MG PO TABS: 1.0000 | ORAL_TABLET | Freq: Every evening | ORAL | Status: DC | PRN

## 2021-01-04 MED ORDER — ATORVASTATIN CALCIUM 20 MG PO TABS
40.0000 mg | ORAL_TABLET | Freq: Every day | ORAL | Status: DC
Start: 2021-01-04 — End: 2021-01-05
  Administered 2021-01-04: 40 mg via ORAL
  Filled 2021-01-04: qty 2

## 2021-01-04 MED ORDER — CLONAZEPAM 1 MG PO TABS: 1.0000 mg | ORAL_TABLET | Freq: Two times a day (BID) | ORAL | Status: DC | PRN

## 2021-01-04 MED ORDER — LABETALOL HCL 5 MG/ML IV SOLN: 5.0000 mg | INTRAVENOUS | Status: DC | PRN

## 2021-01-04 MED ORDER — ZOLPIDEM TARTRATE 5 MG PO TABS
5.0000 mg | ORAL_TABLET | Freq: Every evening | ORAL | Status: DC | PRN
  Administered 2021-01-05: 01:00:00 5 mg via ORAL
  Filled 2021-01-04: qty 1

## 2021-01-04 MED ORDER — RISAQUAD PO CAPS
1.0000 | ORAL_CAPSULE | Freq: Every day | ORAL | Status: DC
  Administered 2021-01-05: 1 via ORAL
  Filled 2021-01-04: qty 1

## 2021-01-04 MED ORDER — ACETAMINOPHEN 160 MG/5ML PO SOLN
650.0000 mg | ORAL | Status: DC | PRN
  Filled 2021-01-04: qty 20.3

## 2021-01-04 MED ORDER — SODIUM CHLORIDE 0.9% FLUSH
3.0000 mL | Freq: Once | INTRAVENOUS | Status: DC
Start: 2021-01-04 — End: 2021-01-05

## 2021-01-04 MED ORDER — STROKE: EARLY STAGES OF RECOVERY BOOK: Freq: Once | Status: AC

## 2021-01-04 MED ORDER — PANTOPRAZOLE SODIUM 40 MG PO TBEC
40.0000 mg | DELAYED_RELEASE_TABLET | Freq: Every day | ORAL | Status: DC
  Administered 2021-01-05: 40 mg via ORAL
  Filled 2021-01-04: qty 1

## 2021-01-04 MED ORDER — ASPIRIN-DIPYRIDAMOLE ER 25-200 MG PO CP12
1.0000 | ORAL_CAPSULE | Freq: Two times a day (BID) | ORAL | Status: DC
  Administered 2021-01-05 (×2): 1 via ORAL
  Filled 2021-01-04 (×4): qty 1

## 2021-01-04 NOTE — ED Provider Notes (Addendum)
[facility] Emergency Department Provider Note  _______________________________________   I have reviewed the triage vital signs and the nursing notes.   HISTORY  Chief Complaint Numbness   History limited by: Not Limited   HPI Darrell Chandler is a 75 y.o. male who presents to the emergency department today because of concerns for an episode of left hand numbness and discoordination.  Patient states he had one episode of left hand issues last week.  It lasted for about an hour.  Was also associated with some the left facial numbness.  It then resolved.  Today the patient stated that he started having the same symptoms again.  He was drinking some coffee and noticed that his hand felt weak.  Today the symptoms only last for a few minutes before resolving.  The patient states that he had similar symptoms back in 2014 at which time he was diagnosed with a stroke.   Records reviewed. Per medical record review patient has a history of CVA.  Past Medical History:  Diagnosis Date  . Anxiety   . Arterial ischemic stroke (HCC)   . Hypercholesterolemia   . Hypertension   . Hypotestosteronemia   . Primary insomnia     There are no problems to display for this patient.   Past Surgical History:  Procedure Laterality Date  . CATARACT EXTRACTION    . COLONOSCOPY    . ESOPHAGOGASTRODUODENOSCOPY N/A 06/06/2019   Procedure: ESOPHAGOGASTRODUODENOSCOPY (EGD);  Surgeon: Toledo, Boykin Nearing, MD;  Location: [facility] ENDOSCOPY;  Service: Gastroenterology;  Laterality: N/A;  . HERNIA REPAIR    . knee arthroscopy    . TRIGGER FINGER RELEASE      Prior to Admission medications   Medication Sig Start Date End Date Taking? Authorizing Provider  amLODipine (NORVASC) 5 MG tablet Take 5 mg by mouth daily.    [provider]  atorvastatin (LIPITOR) 40 MG tablet Take 40 mg by mouth daily.    [provider]  CHOLECALCIFEROL PO Take 5,000 Units by mouth daily.     [provider]  clonazePAM (KLONOPIN) 1 MG tablet Take 1 mg by mouth 2 (two) times daily.    [provider]  cyanocobalamin 1000 MCG tablet Take 1,000 mcg by mouth daily.    [provider]  cyclobenzaprine (FLEXERIL) 10 MG tablet Take 5-10 mg by mouth 2 (two) times daily as needed for muscle spasms.    [provider]  dipyridamole-aspirin (AGGRENOX) 200-25 MG 12hr capsule Take 1 capsule by mouth 2 (two) times daily.    [provider]  metoprolol succinate (TOPROL-XL) 25 MG 24 hr tablet Take 25 mg by mouth daily.    [provider]  pantoprazole (PROTONIX) 40 MG tablet Take 40 mg by mouth daily.    [provider]  Probiotic Product (ACIDOPHILUS PROBIOTIC BLEND PO) Take by mouth daily.    [provider]  zolpidem (AMBIEN) 5 MG tablet Take 5 mg by mouth at bedtime as needed for sleep.    [provider]    Allergies Patient has no known allergies.  History reviewed. No pertinent family history.  Social History Social History   Tobacco Use  . Smoking status: Never Smoker  . Smokeless tobacco: Never Used  Vaping Use  . Vaping Use: Never used  Substance Use Topics  . Alcohol use: Yes    Comment: none last 72hrs  . Drug use: Never    Review of Systems Constitutional: No fever/chills Eyes: No visual  changes. ENT: No sore throat. Cardiovascular: Denies chest pain. Respiratory: Denies shortness of breath. Gastrointestinal: No abdominal pain.  No nausea, no vomiting.  No diarrhea.   Genitourinary: Negative for dysuria. Musculoskeletal: Negative for back pain. Skin: Negative for rash. Neurological: Positive for left hand weakness and dis coordination.  ____________________________________________   PHYSICAL EXAM:  VITAL SIGNS: ED Triage Vitals  Enc Vitals Group     BP 01/04/21 1458 (!) 121/98     Pulse Rate 01/04/21 1458 98     Resp 01/04/21 1458 18     Temp 01/04/21 1458 97.9 F (36.6 C)      Temp Source 01/04/21 1458 Oral     SpO2 01/04/21 1458 91 %     Weight 01/04/21 1457 176 lb (79.8 kg)     Height 01/04/21 1457 5\' 11"  (1.803 m)     Head Circumference --      Peak Flow --      Pain Score 01/04/21 1454 0   Constitutional: Alert and oriented.  Eyes: Conjunctivae are normal.  ENT      Head: Normocephalic and atraumatic.      Nose: No congestion/rhinnorhea.      Mouth/Throat: Mucous membranes are moist.      Neck: No stridor. Hematological/Lymphatic/Immunilogical: No cervical lymphadenopathy. Cardiovascular: Normal rate, regular rhythm.  No murmurs, rubs, or gallops.  Respiratory: Normal respiratory effort without tachypnea nor retractions. Breath sounds are clear and equal bilaterally. No wheezes/rales/rhonchi. Gastrointestinal: Soft and non tender. No rebound. No guarding.  Genitourinary: Deferred Musculoskeletal: Normal range of motion in all extremities. No lower extremity edema. Neurologic:  Normal speech and language. No gross focal neurologic deficits are appreciated.  Skin:  Skin is warm, dry and intact. No rash noted. Psychiatric: Mood and affect are normal. Speech and behavior are normal. Patient exhibits appropriate insight and judgment.  ____________________________________________    LABS (pertinent positives/negatives)  CMP wnl except glu 120, cr 1.31 CBC wbc 7.0, hgb 14.8, plt 201  ____________________________________________   EKG  I, 01/06/21, attending physician, personally viewed and interpreted this EKG  EKG Time: 1457 Rate: 80 Rhythm: normal sinus rhythm Axis: normal Intervals: qtc 447 QRS: narrow ST changes: no st elevation Impression: normal ekg   ____________________________________________    RADIOLOGY  CT head Possible age indeterminate infarct in right posterior frontal lobe  ____________________________________________   PROCEDURES  Procedures  ____________________________________________   INITIAL  IMPRESSION / ASSESSMENT AND PLAN / ED COURSE  Pertinent labs & imaging results that were available during my care of the patient were reviewed by me and considered in my medical decision making (see chart for details).   Patient presented to the emergency department today after an episode of the left hand weakness and numbness.  At the time my exam his symptoms have resolved.  The patient had head CT done from triage which was concerning for a right frontal lobe infarct.  Did have concerns of patient had a recent stroke.  Discussed this with the patient.  Will plan admission for further work-up and management.  ____________________________________________   FINAL CLINICAL IMPRESSION(S) / ED DIAGNOSES  Final diagnoses:  Cerebrovascular accident (CVA), unspecified mechanism (HCC)     Note: This dictation was prepared with Dragon dictation. Any transcriptional errors that result from this process are unintentional     Phineas Semen, MD 01/04/21 1911    01/06/21, MD 01/04/21 [id number]

## 2021-01-04 NOTE — ED Notes (Signed)
Hospitalist at bedside 

## 2021-01-04 NOTE — ED Notes (Signed)
Pt with sensory deficit to left face with less sensation. Pt also with minor left face droop. Denies any other stroke symptoms at this time.

## 2021-01-04 NOTE — ED Triage Notes (Addendum)
Pt comes pov with left hand numbness/weakness, left facial numbness starting at 2pm. This also happened last Thursday. Pt states symptoms have since resolved. Pt is neurologically intact at this time. Droop noted to left side of face that pt states his wife noticed the day before yesterday.    Per Dr Roxan Hockey, stroke orders but not code stroke.

## 2021-01-04 NOTE — H&P (Signed)
History and Physical   Darrell Chandler FBP:[id number] DOB: December 18, 1945 DOA: 01/04/2021  PCP: Lauro Regulus, MD  Outpatient Specialists: Dr. Norma Fredrickson, gastroenterologist Patient coming from: Home  I have personally briefly reviewed patient's old medical records in [facility] EMR.  Chief Concern: Left-sided numbness and tingling  HPI: Darrell Chandler is a 75 y.o. male with medical history significant for hypertension, hyperlipidemia, history of CVA currently on Aggrenox, history of TIA, presented to the emergency department for chief concerns of left upper extremity numbness and tingling, and difficulty walking.  He reports that he initially noticed the symptoms on 01/02/2021.  He states this is similar to his previous stroke episodes.  He states that the episode on Thursday lasted approximately 1 hour.  He reports that he experienced the symptoms again today.  He reports the symptoms started at 2 PM today and lasting about 15 minutes.  He reports the symptoms have now resolved.  He denies headache, vision changes, shortness of breath, chest pain, abdominal pain, nausea, vomiting, dysuria, hematuria, diarrhea.  He endorses compliance with taking his Aggrenox.  He states that he took his Aggrenox a.m. dosing today and has not taken the p.m. dosing.  Social history: retired, formerly worked as Art gallery manager for [employer]. He lives with his spouse, denies tobacco, recreational drug use. His spouse is an alcoholic and he does not keep etoh in the house. He last had a etoh drink on 01/05/21 at lunch with daughter.   Vaccination: three doses of pfizer, 01/03/20, 01/24/20, and 08/22/20.  ROS: Constitutional: no weight change, no fever ENT/Mouth: no sore throat, no rhinorrhea Eyes: no eye pain, no vision changes Cardiovascular: no chest pain, no dyspnea,  no edema, no palpitations Respiratory: no cough, no sputum, no wheezing Gastrointestinal: no nausea, no vomiting, no diarrhea, no constipation Genitourinary:  no urinary incontinence, no dysuria, no hematuria Musculoskeletal: no arthralgias, no myalgias Skin: no skin lesions, no pruritus, Neuro: + weakness, no loss of consciousness, no syncope.  Endorses difficulty walking.  Left upper extremity numbness and tingling. Psych: no anxiety, no depression, no decrease appetite Heme/Lymph: no bruising, no bleeding  ED Course: Discussed with ED provider, patient requiring hospitalization due to strokelike presentation.  Vitals in the emergency department was temperature of 97.9, respiration rate of 15, heart rate of 59, blood pressure 154/96, satting at 98% on room air.  Labs were within normal limits.  EDP ordered a CT of the head without contrast which was read as small area of hypoattenuation in the right subcortical/cortical posterior frontal lobe may represent an age-indeterminate infarct.  The previously demonstrated by MRI lacunar infarcts in the right cerebellum, left thalamus and right parietal lobe are difficult to appreciate by CT.  Assessment/Plan  Active Problems:   TIA (transient ischemic attack)   Stroke-like symptoms - CT head without contrast was read as above - Neurology has been consulted via epic order only  - A.m. team to consult neurology in the a.m. via secure chat as patient is compliant with Aggrenox and still experiencing strokelike symptoms - MRI of the brain - Complete heart echo ordered - Fasting lipid and A1c ordered - Permissive hypertension at this time, holding home antihypertensives - Labetalol 5 mg IV as needed every 2 hours for SBP greater than 180, for 24 hours - Frequent neuro vascular checks - N.p.o. pending swallow study - PT, OT, SLP - Fall precaution   Hyperlipidemia-resumed atorvastatin 40 mg  Insomnia-zolpidem 5 mg nightly as needed for sleep  GERD pantoprazole 40  mg daily  Cardiac monitoring  Chart reviewed.   DVT prophylaxis: SCDs Code Status: Full code Diet: Heart healthy Family  Communication: No Disposition Plan: Pending clinical course Consults called: Recommend a.m. hospitalist team to consult neurology via secure chat in the a.m. Admission status: Observation, MedSurg, with telemetry  Past Medical History:  Diagnosis Date  . Anxiety   . Arterial ischemic stroke (HCC)   . Hypercholesterolemia   . Hypertension   . Hypotestosteronemia   . Primary insomnia    Past Surgical History:  Procedure Laterality Date  . CATARACT EXTRACTION    . COLONOSCOPY    . ESOPHAGOGASTRODUODENOSCOPY N/A 06/06/2019   Procedure: ESOPHAGOGASTRODUODENOSCOPY (EGD);  Surgeon: Toledo, Boykin Nearing, MD;  Location: [facility] ENDOSCOPY;  Service: Gastroenterology;  Laterality: N/A;  . HERNIA REPAIR    . knee arthroscopy    . TRIGGER FINGER RELEASE     Social History:  reports that he has never smoked. He has never used smokeless tobacco. He reports current alcohol use. He reports that he does not use drugs.  No Known Allergies History reviewed. No pertinent family history. Family history: Family history reviewed and not pertinent  Prior to Admission medications   Medication Sig Start Date End Date Taking? Authorizing Provider  amLODipine (NORVASC) 5 MG tablet Take 5 mg by mouth daily.    [provider]  atorvastatin (LIPITOR) 40 MG tablet Take 40 mg by mouth daily.    [provider]  CHOLECALCIFEROL PO Take 5,000 Units by mouth daily.    [provider]  clonazePAM (KLONOPIN) 1 MG tablet Take 1 mg by mouth 2 (two) times daily.    [provider]  cyanocobalamin 1000 MCG tablet Take 1,000 mcg by mouth daily.    [provider]  cyclobenzaprine (FLEXERIL) 10 MG tablet Take 5-10 mg by mouth 2 (two) times daily as needed for muscle spasms.    [provider]  dipyridamole-aspirin (AGGRENOX) 200-25 MG 12hr capsule Take 1 capsule by mouth 2 (two) times daily.    [provider]  metoprolol succinate (TOPROL-XL) 25 MG 24 hr tablet  Take 25 mg by mouth daily.    [provider]  pantoprazole (PROTONIX) 40 MG tablet Take 40 mg by mouth daily.    [provider]  Probiotic Product (ACIDOPHILUS PROBIOTIC BLEND PO) Take by mouth daily.    [provider]  zolpidem (AMBIEN) 5 MG tablet Take 5 mg by mouth at bedtime as needed for sleep.    [provider]   Physical Exam: Vitals:   01/04/21 1458 01/04/21 1756 01/04/21 1802 01/04/21 1900  BP: (!) 121/98 (!) 145/84 137/71 (!) 154/96  Pulse: 98 65  69  Resp: 18 (!) 24 14 18   Temp: 97.9 F (36.6 C)     TempSrc: Oral     SpO2: 91% 94%  94%  Weight:      Height:       Constitutional: appears age-appropriate, NAD, calm, comfortable Eyes: PERRL, lids and conjunctivae normal ENMT: Mucous membranes are moist. Posterior pharynx clear of any exudate or lesions. Age-appropriate dentition. Hearing appropriate Neck: normal, supple, no masses, no thyromegaly Respiratory: clear to auscultation bilaterally, no wheezing, no crackles. Normal respiratory effort. No accessory muscle use.  Cardiovascular: Regular rate and rhythm, no murmurs / rubs / gallops. No extremity edema. 2+ pedal pulses. No carotid bruits.  Abdomen: no tenderness, no masses palpated, no hepatosplenomegaly. Bowel sounds positive.  Musculoskeletal: no clubbing / cyanosis. No joint deformity upper and lower  extremities. Good ROM, no contractures, no atrophy. Normal muscle tone.  Skin: no rashes, lesions, ulcers. No induration Neurologic: Sensation intact. Strength 5/5 in all 4.  Psychiatric: Normal judgment and insight. Alert and oriented x 3. Normal mood.   EKG: independently reviewed, showing normal sinus rhythm, rate of 80, QTc 447  Chest x-ray on Admission: I personally reviewed and I agree with radiologist reading as below.  CT HEAD WO CONTRAST  Result Date: 01/04/2021 CLINICAL DATA:  Hand numbness and weakness.  Left facial numbness. EXAM: CT HEAD WITHOUT CONTRAST TECHNIQUE:  Contiguous axial images were obtained from the base of the skull through the vertex without intravenous contrast. COMPARISON:  MRI of the head November 17, 2017, CT of the head October 30, 2013 FINDINGS: Brain: No evidence of acute hemorrhage, hydrocephalus, extra-axial collection or mass lesion/mass effect. Small area of hypoattenuation in the right subcortical/cortical posterior frontal lobe. The previously demonstrated by MRI lacunar infarcts in the right cerebellum, left thalamus and right parietal lobe are difficult to appreciate by CT. Mild deep white matter microangiopathy. Vascular: No hyperdense vessel or unexpected calcification. Skull: Normal. Negative for fracture or focal lesion. Sinuses/Orbits: No acute finding. Other: None. IMPRESSION: 1. Small area of hypoattenuation in the right subcortical/cortical posterior frontal lobe may represent an age indeterminate infarct. 2. The previously demonstrated by MRI lacunar infarcts in the right cerebellum, left thalamus and right parietal lobe are difficult to appreciate by CT. Electronically Signed   By: Ted Mcalpine M.D.   On: 01/04/2021 16:38   Labs on Admission: I have personally reviewed following labs  CBC: Recent Labs  Lab 01/04/21 1459  WBC 7.0  NEUTROABS 4.0  HGB 14.8  HCT 42.5  MCV 98.2  PLT 201   Basic Metabolic Panel: Recent Labs  Lab 01/04/21 1459  NA 138  K 3.7  CL 105  CO2 23  GLUCOSE 120*  BUN 18  CREATININE 1.31*  CALCIUM 9.5   GFR: Estimated Creatinine Clearance: 51.9 mL/min (A) (by C-G formula based on SCr of 1.31 mg/dL (H)).  Liver Function Tests: Recent Labs  Lab 01/04/21 1459  AST 24  ALT 16  ALKPHOS 49  BILITOT 1.0  PROT 7.5  ALBUMIN 4.3   Coagulation Profile: Recent Labs  Lab 01/04/21 1459  INR 1.0   CBG: Recent Labs  Lab 01/04/21 1510  GLUCAP 118*   Amy N Cox D.O. [facility]  If 7PM-7AM, please contact overnight-coverage provider If 7AM-7PM, please contact day  coverage provider [email or web address]  01/04/2021, 7:22 PM

## 2021-01-04 NOTE — ED Notes (Addendum)
Pt ambulatory to bathroom in room 11 at this time. Pt returned to bed with wife at bedside. Update provided to pt and wife with all questions answered. MRI tech at bedside screening pt at this time.

## 2021-01-04 NOTE — ED Notes (Signed)
MD messaged regarding diet order as pt passed swallow screen.

## 2021-01-04 NOTE — ED Notes (Signed)
Pt sats from 88-91% on RA. Pt placed on 2L [place].

## 2021-01-05 ENCOUNTER — Other Ambulatory Visit: Payer: Self-pay

## 2021-01-05 ENCOUNTER — Observation Stay
Admit: 2021-01-05 | Discharge: 2021-01-05 | Disposition: A | Discharge status: Home / Self Care | Payer: Medicare Other | Attending: Internal Medicine | Admitting: Internal Medicine

## 2021-01-05 DIAGNOSIS — G459 Transient cerebral ischemic attack, unspecified: Secondary | ICD-10-CM | POA: Diagnosis not present

## 2021-01-05 DIAGNOSIS — I639 Cerebral infarction, unspecified: Secondary | ICD-10-CM | POA: Diagnosis not present

## 2021-01-05 LAB — LIPID PANEL
Cholesterol: 124 mg/dL (ref 0–200)
HDL: 35 mg/dL — ABNORMAL LOW (ref >40)
LDL Cholesterol: 59 mg/dL (ref 0–99)
Total CHOL/HDL Ratio: 3.5 RATIO
Triglycerides: 151 mg/dL — ABNORMAL HIGH (ref <150)
VLDL: 30 mg/dL (ref 0–40)

## 2021-01-05 LAB — ECHOCARDIOGRAM COMPLETE
AV Peak grad: 4.2 mmHg
Ao pk vel: 1.03 m/s
Area-P 1/2: 3.68 cm2
Height: 71 in
S' Lateral: 3.9 cm
Weight: 2275.2 oz

## 2021-01-05 LAB — HEMOGLOBIN A1C
Hgb A1c MFr Bld: 5.8 % — ABNORMAL HIGH (ref 4.8–5.6)
Mean Plasma Glucose: 119.76 mg/dL

## 2021-01-05 LAB — SARS CORONAVIRUS 2 (TAT 6-24 HRS): SARS Coronavirus 2: NEGATIVE

## 2021-01-05 MED ORDER — PERFLUTREN LIPID MICROSPHERE
1.0000 mL | INTRAVENOUS | Status: AC | PRN
  Administered 2021-01-05: 6 mL via INTRAVENOUS
  Filled 2021-01-05: qty 10

## 2021-01-05 NOTE — Plan of Care (Signed)
  Problem: Education: Goal: Knowledge of disease or condition will improve Outcome: Progressing Goal: Knowledge of secondary prevention will improve Outcome: Progressing Goal: Knowledge of patient specific risk factors addressed and post discharge goals established will improve Outcome: Progressing   Problem: Coping: Goal: Will verbalize positive feelings about self Outcome: Progressing Goal: Will identify appropriate support needs Outcome: Progressing   Problem: Health Behavior/Discharge Planning: Goal: Ability to manage health-related needs will improve Outcome: Progressing   Problem: Self-Care: Goal: Ability to participate in self-care as condition permits will improve Outcome: Progressing Goal: Verbalization of feelings and concerns over difficulty with self-care will improve Outcome: Progressing Goal: Ability to communicate needs accurately will improve Outcome: Progressing   Problem: Ischemic Stroke/TIA Tissue Perfusion: Goal: Complications of ischemic stroke/TIA will be minimized Outcome: Progressing

## 2021-01-05 NOTE — Progress Notes (Addendum)
The patient left AMA prior to being seen by Neurology.  Report and images from MRI brain without contrast were reviewed. There is an unusual signal abnormality in one of the gyri of his posterior right frontal lobe, in the region of the motor strip. It could be consistent with cortical edema from seizure, or a low grade glioma. 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 The anatomical location of the lesion is concordant with the patient's presenting symptoms. I have contacted Dr. Sharon Seller. The patient should return to the hospital  for continued work up to further investigate the lesion. He will need MRI brain with contrast (initial study was without contrast) and an EEG. The lesion does not look likely to be due to a stroke.   Electronically signed: Dr. Caryl Pina

## 2021-01-05 NOTE — Progress Notes (Signed)
No neuro changes from initial assessment on 01/05/21 at [facility]

## 2021-01-05 NOTE — Progress Notes (Signed)
*  PRELIMINARY RESULTS* Echocardiogram 2D Echocardiogram has been performed. Definity IV Contrast was used on this study.  Garrel Ridgel Marletta Bousquet 01/05/2021, 10:58 AM

## 2021-01-05 NOTE — Evaluation (Signed)
Physical Therapy Evaluation Patient Details Name: Darrell Chandler MRN: [id number] DOB: [birth year] Today's Date: 01/05/2021   History of Present Illness  Pt is a 75 y/o M admitted from home with c/c of L UE numbness & tingling & difficulty walking. Per chart "CT of the head without contrast which was read as small area of hypoattenuation in the right subcortical/cortical posterior frontal lobe may represent an age-indeterminate infarct.  The previously demonstrated by MRI lacunar infarcts in the right cerebellum, left thalamus and right parietal lobe are difficult to appreciate by CT." PMH: HTN, HLD, CVA, TIA, anxiety, insomnia  Clinical Impression  Pt received in recliner with PT educating pt on purpose of visit & pt stating "it wouldn't hurt to have a 2nd opinion". Pt on telemetry & SpO2 noted to be 88-89% on room air, slowly increasing to 90-91%. Pt dons shoes independently & stands when SPO2 reads 82% then 78% but quickly increases to 88%. Pt ambulates 3 laps with SPO2 >90% throughout & pt denying any SOB during session. Pt notes at occasional medical appointments his SpO2 will be low but increase to WNL. Pt left in recliner with SpO2 >90% & nurse made aware of oxygen saturations throughout session. Pt with no current needs re: functional mobility, PT will sign off at this time.     Follow Up Recommendations No PT follow up    Equipment Recommendations  None recommended by PT    Recommendations for Other Services       Precautions / Restrictions Precautions Precautions: None Restrictions Weight Bearing Restrictions: No      Mobility  Bed Mobility               General bed mobility comments: not observed as pt received & left sitting in recliner    Transfers Overall transfer level: Independent                  Ambulation/Gait Ambulation/Gait assistance: Independent Gait Distance (Feet): 500 Feet Assistive device: None Gait Pattern/deviations: WFL(Within Functional  Limits)        Stairs            Wheelchair Mobility    Modified Rankin (Stroke Patients Only)       Balance Overall balance assessment: No apparent balance deficits (not formally assessed)                                           Pertinent Vitals/Pain Pain Assessment: No/denies pain    Home Living Family/patient expects to be discharged to:: Private residence Living Arrangements: Spouse/significant other Available Help at Discharge: Family Type of Home: House Home Access: Level entry     Home Layout: Multi-level;Able to live on main level with bedroom/bathroom        Prior Function Level of Independence: Independent         Comments: driving     Hand Dominance        Extremity/Trunk Assessment   Upper Extremity Assessment Upper Extremity Assessment: Overall WFL for tasks assessed (BUE finger to nose grossly equal)    Lower Extremity Assessment Lower Extremity Assessment: Overall WFL for tasks assessed (BLE heel to shin grossly equal)       Communication   Communication: No difficulties  Cognition Arousal/Alertness: Awake/alert Behavior During Therapy: WFL for tasks assessed/performed Overall Cognitive Status: Within Functional Limits for tasks assessed  General Comments      Exercises     Assessment/Plan    PT Assessment Patent does not need any further PT services  PT Problem List         PT Treatment Interventions      PT Goals (Current goals can be found in the Care Plan section)  Acute Rehab PT Goals Patient Stated Goal: go home PT Goal Formulation: With patient Time For Goal Achievement: 01/19/21 Potential to Achieve Goals: Good    Frequency     Barriers to discharge        Co-evaluation               AM-PAC PT "6 Clicks" Mobility  Outcome Measure Help needed turning from your back to your side while in a flat bed without using  bedrails?: None Help needed moving from lying on your back to sitting on the side of a flat bed without using bedrails?: None Help needed moving to and from a bed to a chair (including a wheelchair)?: None Help needed standing up from a chair using your arms (e.g., wheelchair or bedside chair)?: None Help needed to walk in hospital room?: None Help needed climbing 3-5 steps with a railing? : None 6 Click Score: 24    End of Session   Activity Tolerance: Patient tolerated treatment well Patient left: in chair;with call bell/phone within reach Nurse Communication:  (O2)      Time: 3532-9924 PT Time Calculation (min) (ACUTE ONLY): 13 min   Charges:   PT Evaluation $PT Eval Low Complexity: 1 Low          Aleda Grana, PT, DPT 01/05/21, 4:52 PM   Sandi Mariscal 01/05/2021, 4:50 PM

## 2021-01-05 NOTE — Progress Notes (Signed)
OT Cancellation Note  Patient Details Name: Darrell Chandler MRN: [id number] DOB: 1945/12/07   Cancelled Treatment:    Reason Eval/Treat Not Completed: OT screened, no needs identified, will sign off. Order received and chart reviewed. Pt reports independently walking to bathroom and nurses station. Pt reports that he is back to baseline functional independence. No skilled OT needs identified. Will sign off. Please re-consult if additional needs arise.   Matthew Folks, OTR/L [facility] [phone]

## 2021-01-05 NOTE — Progress Notes (Incomplete)
Darrell Chandler  GNF:[id number] DOB: 1946-05-09 DOA: 01/04/2021 PCP: Lauro Regulus, MD    Brief Narrative:  [phone] with a history of HTN, HLD, and CVA on chronic Aggrenox who presented to the ER with left upper extremity numbness and tingling as well as difficulty walking which he reported had been occurring in an off/on fashion with onset 2/24.  CT head in the ER revealed a small area of hypoattenuation in the right subcortical/cortical posterior frontal lobe suggestive of possible infarct.  Significant Events:  2/26 admit via ER  Antimicrobials:  None  DVT prophylaxis: Lovenox  Subjective: Afebrile.  Blood pressure 115/87.  94% on room air.  At the time of my visit the patient is resting comfortably in his bed.  He has been up walking around the unit without difficulty.  He tells me he is very anxious to get home as soon as possible.  I explained to the patient that there were findings on his CT and even the follow-up MRI which were not entirely clear and that we needed neurology to see him to provide recommendations about his care going further.  I assured him we would expedite things as much as possible but noted that the neurology team only had one physician working and that he was in fact quite busy.  I assured the patient if he could be patient we would make sure he received a complete and thorough work-up and that we would discharge him as soon as we safely could.   Unfortunately after I left the unit the patient decided he wanted to leave AGAINST MEDICAL ADVICE to go home.  His nurse counseled him as to the risk involved and advised him to stay until he could at least be seen by the neurologist.  He was not willing to do so and signed out AMA.  Approximately 10 minutes after leaving the floor the neurology physician arrived to evaluate the patient.  Upon discovering that the patient had left Dr. Otelia Limes contacted me to inform me that he noted an abnormal area of unusual signal  density within the gyri of the right posterior frontal lobe at the motor strip.  Dr. Shelbie Hutching recommendation was going to be for the patient to undergo an MRI with contrast and EEG while still in the hospital.  Dr. Otelia Limes stated this was a time sensitive issue and needed to be investigated.  I was able to reach the patient via phone.  He had already arrived at home.  I explained Dr. Shelbie Hutching findings with him and discussed the potential items on the differential to include seizure or even a low-grade glioma.  I explained to him that he needed to have these evaluated.  I encouraged him to return to the ED to be readmitted.  The patient informed me that he was going to be seen at [facility] clinic 2/28 for an injection in his back and he assured me that he would discuss these findings with Dr. Geralyn Flash office and arrange to have these tests done as an outpatient through this office.  I advised him that these test needed to be accomplished within the next 1 to 2 weeks at maximum, but that the safest approach would be for him to be readmitted.  He committed to ensuring these issues were evaluated as an outpatient in a timely manner.  I encouraged him to return to the ED to be readmitted if he faces any difficulty accomplishing this work-up as an outpatient.  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 rate 16, height 5\' 11"  (1.803 m), weight 64.5 kg, SpO2 94 %. No intake or output data in the 24 hours ending 01/05/21 0857 Filed Weights   01/04/21 1457 01/05/21 0005  Weight: 79.8 kg 64.5 kg    Examination: General: No acute respiratory distress Lungs: Clear to auscultation bilaterally without wheezes or crackles Cardiovascular: Regular rate and rhythm without murmur gallop or rub normal S1 and S2 Abdomen: Nontender, nondistended, soft, bowel sounds positive, no rebound, no ascites, no appreciable mass Extremities: No significant cyanosis, clubbing, or edema bilateral lower extremities  CBC: Recent Labs  Lab 01/04/21 1459  WBC 7.0  NEUTROABS 4.0  HGB 14.8  HCT 42.5  MCV 98.2  PLT 201   Basic Metabolic Panel: Recent Labs  Lab 01/04/21 1459  NA 138  K 3.7  CL 105  CO2 23  GLUCOSE 120*  BUN 18  CREATININE 1.31*  CALCIUM 9.5   GFR: Estimated Creatinine Clearance: 44.4 mL/min (A) (by C-G formula based on SCr of 1.31 mg/dL (H)).  Liver Function Tests: Recent Labs  Lab 01/04/21 1459  AST 24  ALT 16  ALKPHOS 49  BILITOT 1.0  PROT 7.5  ALBUMIN 4.3   No results for input(s): LIPASE, AMYLASE in the last 168 hours. No results for input(s): AMMONIA in the last 168 hours.  Coagulation Profile: Recent Labs  Lab 01/04/21 1459  INR 1.0    Cardiac Enzymes: No results for input(s): CKTOTAL, CKMB, CKMBINDEX, TROPONINI in the last 168 hours.  HbA1C: Hemoglobin A1C  Date/Time Value Ref Range Status  10/29/2013 05:45 AM 5.3 4.2 - 6.3 % Final    Comment:    The American Diabetes Association recommends that a primary goal of therapy should be <7% and that physicians should reevaluate the treatment regimen in patients with HbA1c values consistently >8%.     CBG: Recent Labs  Lab 01/04/21 1510  GLUCAP 118*    Recent Results (from the past 240 hour(s))  SARS CORONAVIRUS  2 (TAT 6-24 HRS) Nasopharyngeal Nasopharyngeal Swab     Status: None   Collection Time: 01/04/21  7:32 PM   Specimen: Nasopharyngeal Swab  Result Value Ref Range Status   SARS Coronavirus 2 NEGATIVE NEGATIVE Final    Comment: (NOTE) SARS-CoV-2 target nucleic acids are NOT DETECTED.  The SARS-CoV-2 RNA is generally detectable in upper and lower respiratory specimens during the acute phase of infection. Negative results do not preclude SARS-CoV-2 infection, do not rule out co-infections with other pathogens, and should not be used as the sole basis for treatment or other patient management decisions. Negative results must be combined with clinical observations, patient history, and epidemiological  information. The expected result is Negative.  Fact Sheet for Patients: HairSlick.no  Fact Sheet for Healthcare Providers: quierodirigir.com  This test is not yet approved or cleared by the [place] FDA and  has been authorized for detection and/or diagnosis of SARS-CoV-2 by FDA under an Emergency Use Authorization (EUA). This EUA will remain  in effect (meaning this test can be used) for the duration of the COVID-19 declaration under Se ction 564(b)(1) of the Act, 21 U.S.C. section 360bbb-3(b)(1), unless the authorization is terminated or revoked sooner.  Performed at [facility], [address], [place], [place] [address]      Scheduled Meds: . acidophilus  1 capsule Oral Daily  . atorvastatin  40 mg Oral Daily  . dipyridamole-aspirin  1 capsule Oral BID  . pantoprazole  40 mg Oral Daily  . sodium chloride flush  3 mL Intravenous Once   Continuous Infusions:   LOS: 0 days   Lonia Blood, MD [facility] Office  [phone] Pager - Text Page per Amion  If 7PM-7AM, please contact night-coverage per Amion 01/05/2021, 8:57 AM

## 2021-01-05 NOTE — Progress Notes (Signed)
Pt leaving AMA. Educated pt on reasons to stay in hospital and what it means to leave AMA. Pt understands and is will wanting to leave. AMA paper signed. MD made aware. IV removed intact. VSS. Telemonitoring unhooked from pt. Pt A&Ox4. Independent in room. Awaiting daughter to bring his keys so he can leave.

## 2021-01-05 NOTE — Discharge Summary (Signed)
Discharge Summary / LEFT AMA   Darrell Chandler MRN - [id number] DOB - Aug 15, 1946  Date of Admission - 01/04/2021 Date Left AMA: 01/05/2021  Attending Physician:  Jetty Duhamel T  Patient's PCP:  Lauro Regulus, MD  Disposition: LEFT AMA  Follow-up Appts:  Not able to be arranged as pt left AMA  Diagnoses at time pt left AMA: Strokelike symptoms - TIA versus CVA v/s Seizure v/s Glioma  Initial presentation: 74yo with a history of HTN, HLD, and CVA on chronic Aggrenox who presented to the ER with left upper extremity numbness and tingling as well as difficulty walking which he reported had been occurring in an off/on fashion with onset 2/24.  CT head in the ER revealed a small area of hypoattenuation in the right subcortical/cortical posterior frontal lobe suggestive of possible infarct.  Hospital Course: At the time of my visit the patient is resting comfortably in his bed.  He has been up walking around the unit without difficulty.  He tells me he is very anxious to get home as soon as possible.  I explained to the patient that there were findings on his CT and even the follow-up MRI which were not entirely clear and that we needed Neurology to see him to provide recommendations about his care going forward.  I assured him we would expedite things as much as possible but noted that the Neurology team was in fact quite busy and would see him asap.  I assured the patient we would make sure he received a complete and thorough work-up and that we would discharge him as soon as we safely could.   Unfortunately after I left the unit the patient decided he wanted to leave AGAINST MEDICAL ADVICE to go home.  His nurse counseled him as to the risk involved and advised him to stay until he could at least be seen by the Neurologist.  He was not willing to do so and signed out AMA.  Approximately 10 minutes after the patient left the floor the Neurology physician arrived to evaluate him.   Upon discovering that the patient had left Dr. Otelia Limes contacted me to inform me that he noted an abnormal area of unusual signal density within a gyrus of the right posterior frontal lobe at the motor strip.  Dr. Shelbie Hutching recommendation was going to be for the patient to undergo an MRI with contrast and EEG while still in the hospital.  Dr. Otelia Limes stated this was a time sensitive issue and needed to be investigated in a timely manner.   I was able to reach the patient via phone.  He had already arrived at home.  I explained Dr. Shelbie Hutching findings with him and discussed the potential items on the differential to include seizure or even a low-grade glioma.  I explained to him that he needed to have this evaluated further.  I encouraged him to return to the ED to be readmitted.  The patient informed me that he was going to be seen at [facility] 2/28 for an injection in his back and he assured me that he would discuss these findings with Dr. Daisy Blossom office (whom he said is his outpatient Neurologist) and arrange to have these tests done as an outpatient.  I advised him that these test needed to be accomplished asap but certainly within the next 1 to 2 weeks at maximum, but that the safest approach would be for him to be readmitted.  He committed to ensuring these  issues were evaluated as an outpatient in a timely manner.  I encouraged him to return to the ED to be readmitted if he faces any difficulty accomplishing this work-up as an outpatient. I also instructed him to direct his Neurologist to this electronic chart in order to review the suggestions of Dr. Otelia Limes directly.     Medication List    Unable to be finalized as pt LEFT AMA  Day of Discharge  Objective: Blood pressure 115/87, pulse 64, temperature 97.6 F (36.4 C), resp. rate 16, height 5\' 11"  (1.803 m), weight 64.5 kg, SpO2 94 %. No intake or output data in the 24 hours ending 01/05/21 0857     Filed Weights   01/04/21 1457  01/05/21 0005  Weight: 79.8 kg 64.5 kg    Examination: General: No acute respiratory distress Lungs: Clear to auscultation bilaterally without wheezes or crackles Cardiovascular: Regular rate and rhythm without murmur gallop or rub normal S1 and S2 Abdomen: Nontender, nondistended, soft, bowel sounds positive, no rebound, no ascites, no appreciable mass Extremities: No significant cyanosis, clubbing, or edema bilateral lower extremities  CBC: Last Labs      Recent Labs  Lab 01/04/21 1459  WBC 7.0  NEUTROABS 4.0  HGB 14.8  HCT 42.5  MCV 98.2  PLT 201     Basic Metabolic Panel: Last Labs      Recent Labs  Lab 01/04/21 1459  NA 138  K 3.7  CL 105  CO2 23  GLUCOSE 120*  BUN 18  CREATININE 1.31*  CALCIUM 9.5     GFR: Estimated Creatinine Clearance: 44.4 mL/min (A) (by C-G formula based on SCr of 1.31 mg/dL (H)).  Liver Function Tests: Last Labs      Recent Labs  Lab 01/04/21 1459  AST 24  ALT 16  ALKPHOS 49  BILITOT 1.0  PROT 7.5  ALBUMIN 4.3      Coagulation Profile: Last Labs      Recent Labs  Lab 01/04/21 1459  INR 1.0      HbA1C: Last Labs         Hemoglobin A1C  Date/Time Value Ref Range Status  10/29/2013 05:45 AM 5.3 4.2 - 6.3 % Final    Comment:    The American Diabetes Association recommends that a primary goal of therapy should be <7% and that physicians should reevaluate the treatment regimen in patients with HbA1c values consistently >8%.       CBG: Last Labs      Recent Labs  Lab 01/04/21 1510  GLUCAP 118*             Recent Results (from the past 240 hour(s))  SARS CORONAVIRUS 2 (TAT 6-24 HRS) Nasopharyngeal Nasopharyngeal Swab     Status: None   Collection Time: 01/04/21  7:32 PM   Specimen: Nasopharyngeal Swab  Result Value Ref Range Status   SARS Coronavirus 2 NEGATIVE NEGATIVE Final    Comment: (NOTE) SARS-CoV-2 target nucleic acids are NOT DETECTED.  The SARS-CoV-2 RNA is  generally detectable in upper and lower respiratory specimens during the acute phase of infection. Negative results do not preclude SARS-CoV-2 infection, do not rule out co-infections with other pathogens, and should not be used as the sole basis for treatment or other patient management decisions. Negative results must be combined with clinical observations, patient history, and epidemiological information. The expected result is Negative.  Fact Sheet for Patients: 01/06/21  Fact Sheet for Healthcare Providers: HairSlick.no  This test is not yet  approved or cleared by the [place] and  has been authorized for detection and/or diagnosis of SARS-CoV-2 by FDA under an Emergency Use Authorization (EUA). This EUA will remain  in effect (meaning this test can be used) for the duration of the COVID-19 declaration under Se ction 564(b)(1) of the Act, 21 U.S.C. section 360bbb-3(b)(1), unless the authorization is terminated or revoked sooner.  Performed at [facility], [address], [place], [place] [address]      7:01 PM 01/05/21  Lonia Blood, MD [facility]  [phone]

## 2021-01-10 ENCOUNTER — Other Ambulatory Visit (HOSPITAL_COMMUNITY): Payer: Self-pay | Admitting: Neurology

## 2021-01-10 ENCOUNTER — Other Ambulatory Visit: Payer: Self-pay | Admitting: Neurology

## 2021-01-10 DIAGNOSIS — G9389 Other specified disorders of brain: Secondary | ICD-10-CM

## 2021-01-24 ENCOUNTER — Ambulatory Visit
Admission: RE | Admit: 2021-01-24 | Discharge: 2021-01-24 | Disposition: A | Discharge status: Home / Self Care | Payer: Medicare Other | Source: Ambulatory Visit | Attending: Neurology | Admitting: Neurology

## 2021-01-24 ENCOUNTER — Other Ambulatory Visit: Payer: Self-pay

## 2021-01-24 DIAGNOSIS — G9389 Other specified disorders of brain: Secondary | ICD-10-CM | POA: Diagnosis present

## 2021-01-24 MED ORDER — GADOBUTROL 1 MMOL/ML IV SOLN
6.0000 mL | Freq: Once | INTRAVENOUS | Status: AC | PRN
  Administered 2021-01-24: 6 mL via INTRAVENOUS

## 2021-02-12 ENCOUNTER — Ambulatory Visit (INDEPENDENT_AMBULATORY_CARE_PROVIDER_SITE_OTHER): Payer: Medicare Other | Admitting: Podiatry

## 2021-02-12 ENCOUNTER — Other Ambulatory Visit: Payer: Self-pay

## 2021-02-12 ENCOUNTER — Encounter: Payer: Self-pay | Admitting: Podiatry

## 2021-02-12 DIAGNOSIS — L600 Ingrowing nail: Secondary | ICD-10-CM | POA: Diagnosis not present

## 2021-02-12 NOTE — Patient Instructions (Signed)

## 2021-02-12 NOTE — Progress Notes (Signed)
  Subjective:  Patient ID: Darrell Chandler, male    DOB: July 20, 1946,  MRN: [id number]  Chief Complaint  Patient presents with  . Ingrown Toenail    Patient presents today for ingrown toenail left hallux medial border x 2-3 weeks.    75 y.o. male presents with the above complaint. History confirmed with patient.  The right side occasion bothersome but the left is the primary issue.  His wife is a patient of mine  Objective:  Physical Exam: warm, good capillary refill, no trophic changes or ulcerative lesions, normal DP and PT pulses and normal sensory exam. Left Foot: Hallux medial border ingrowing with previous paronychia evidence Assessment:   1. Ingrowing left great toenail      Plan:  Patient was evaluated and treated and all questions answered.    Ingrown Nail, left -Patient elects to proceed with minor surgery to remove ingrown toenail today. Consent reviewed and signed by patient. -Ingrown nail excised. See procedure note. -Educated on post-procedure care including soaking. Written instructions provided and reviewed. -Patient to follow up in 2 weeks for nail check.  Procedure: Excision of Ingrown Toenail Location: Left 1st toe medial nail borders. Anesthesia: Lidocaine 1% plain; 1.5 mL and Marcaine 0.5% plain; 1.5 mL, digital block. Skin Prep: Betadine. Dressing: Silvadene; telfa; dry, sterile, compression dressing. Technique: Following skin prep, the toe was exsanguinated and a tourniquet was secured at the base of the toe. The affected nail border was freed, split with a nail splitter, and excised. Chemical matrixectomy was then performed with phenol and irrigated out with alcohol. The tourniquet was then removed and sterile dressing applied. Disposition: Patient tolerated procedure well. Patient to return in 2 weeks for follow-up.     Return in about 2 weeks (around 02/26/2021) for nail re-check.

## 2021-02-26 ENCOUNTER — Ambulatory Visit (INDEPENDENT_AMBULATORY_CARE_PROVIDER_SITE_OTHER): Payer: Medicare Other | Admitting: Podiatry

## 2021-02-26 ENCOUNTER — Encounter: Payer: Self-pay | Admitting: Podiatry

## 2021-02-26 ENCOUNTER — Other Ambulatory Visit: Payer: Self-pay

## 2021-02-26 DIAGNOSIS — L600 Ingrowing nail: Secondary | ICD-10-CM

## 2021-03-02 NOTE — Progress Notes (Signed)
  Subjective:  Patient ID: Darrell Chandler, male    DOB: [birth year],  MRN: [id number]  Chief Complaint  Patient presents with  . Ingrown Toenail    "its doing better.  It still oozes some.  My right big toe is starting to get sore now on the right side of the toe"    75 y.o. male returns with the above complaint. History confirmed with patient.  Healing well, has some pain on the right side  Objective:  Physical Exam: warm, good capillary refill, no trophic changes or ulcerative lesions, normal DP and PT pulses and normal sensory exam. Left Foot: Hallux medial border ingrowing with previous paronychia evidence Right hallux mild pincer nail Assessment:   1. Ingrowing right great toenail   2. Ingrowing left great toenail      Plan:  Patient was evaluated and treated and all questions answered.    Ingrown Nail, left -Healing well leave open to air can discontinue soaks and ointment and resume regular shoe gear and bathing  Short amount of cut the nail on the right side so he can get the offending borders and not cause recurrent ingrown's or infections    Return if symptoms worsen or fail to improve.

## 2022-05-27 ENCOUNTER — Ambulatory Visit (INDEPENDENT_AMBULATORY_CARE_PROVIDER_SITE_OTHER): Payer: Medicare Other | Admitting: Podiatry

## 2022-05-27 DIAGNOSIS — L600 Ingrowing nail: Secondary | ICD-10-CM

## 2022-05-27 MED ORDER — NEOMYCIN-POLYMYXIN-HC 3.5-10000-1 OT SUSP: OTIC | 0 refills | Status: AC

## 2022-05-27 NOTE — Patient Instructions (Signed)

## 2022-06-01 ENCOUNTER — Encounter: Payer: Self-pay | Admitting: Podiatry

## 2022-06-01 NOTE — Progress Notes (Signed)
  Subjective:  Patient ID: Darrell Chandler, male    DOB: [birth year],  MRN: [id number]  Chief Complaint  Patient presents with   Ingrown Toenail    76 y.o. male presents with the above complaint. History confirmed with patient.  He returns for follow-up with a right ingrown toenail that has worsened again, the left side where the matricectomy was done is doing very well  Objective:  Physical Exam: warm, good capillary refill, no trophic changes or ulcerative lesions, normal DP and PT pulses, normal sensory exam, and ingrown right hallux nail medial and lateral borders Assessment:   1. Ingrowing right great toenail      Plan:  Patient was evaluated and treated and all questions answered.    Ingrown Nail, right -Patient elects to proceed with minor surgery to remove ingrown toenail today. Consent reviewed and signed by patient. -Ingrown nail excised. See procedure note. -Educated on post-procedure care including soaking. Written instructions provided and reviewed. -Patient to follow up in 2 weeks for nail check.  Procedure: Excision of Ingrown Toenail Location: Right 1st toe  medial and lateral  nail borders. Anesthesia: Lidocaine 1% plain; 1.5 mL and Marcaine 0.5% plain; 1.5 mL, digital block. Skin Prep: Betadine. Dressing: Silvadene; telfa; dry, sterile, compression dressing. Technique: Following skin prep, the toe was exsanguinated and a tourniquet was secured at the base of the toe. The affected nail border was freed, split with a nail splitter, and excised. Chemical matrixectomy was then performed with phenol and irrigated out with alcohol. The tourniquet was then removed and sterile dressing applied. Disposition: Patient tolerated procedure well. Patient to return in 2 weeks for follow-up.    No follow-ups on file.

## 2022-11-14 IMAGING — MR MR HEAD WO/W CM
14 series · 48 of 48 positions shown · IV contrast (gadavist)
Comparison: 01/04/2021.  11/17/2017.

CLINICAL DATA: Stroke-like symptoms in [REDACTED]. Evaluate frontal
lesion regarding the possibility of a mass.

EXAM:
MRI HEAD WITHOUT AND WITH CONTRAST
TECHNIQUE: Multiplanar, multiecho pulse sequences of the brain and surrounding
structures were obtained without and with intravenous contrast.
CONTRAST:  6mL GADAVIST GADOBUTROL 1 MMOL/ML IV SOLN

[Series 5: ax dwi_tracew · axial · 3.0mm · 0.65mm/px · z∈[-82,+79]mm · 3 of 50 slices shown]
[im 1/50]
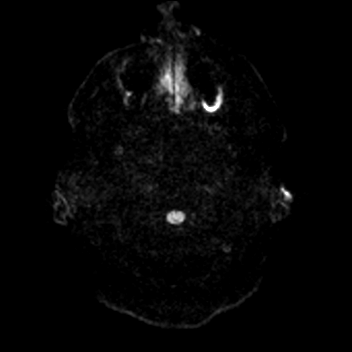
[im 25/50]
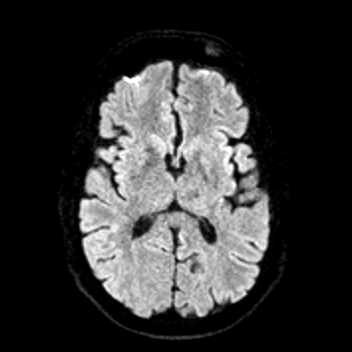
[im 50/50]
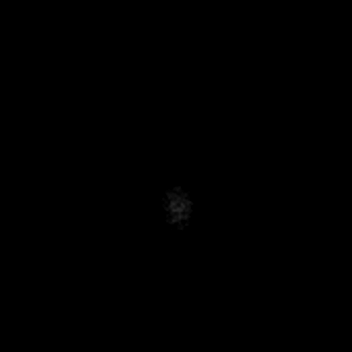

[Series 6: ax dwi_adc · axial · 3.0mm · 0.65mm/px · z∈[-82,+79]mm · 3 of 50 slices shown]
[im 1/50]
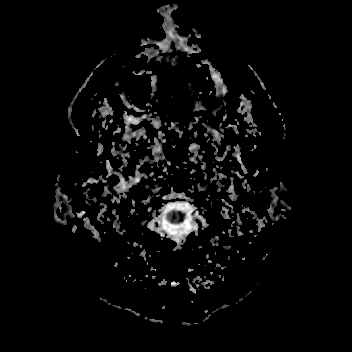
[im 25/50]
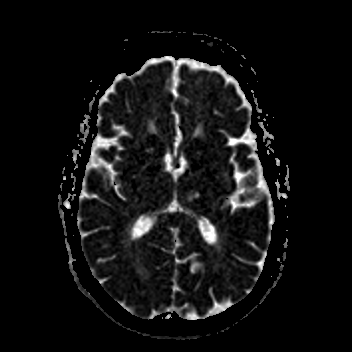
[im 50/50]
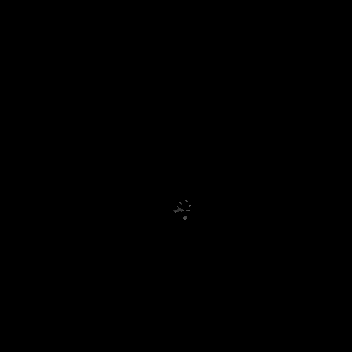

[Series 7: cor dwi_tracew · coronal · 5.0mm · 0.65mm/px · 2 of 40 slices shown]
[im 1/40]
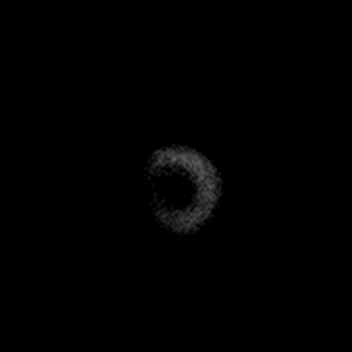
[im 40/40]
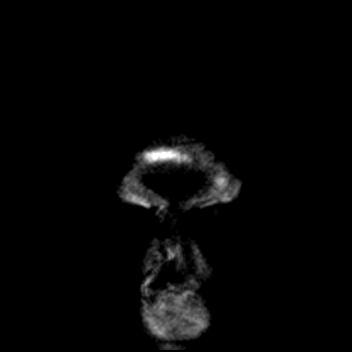

[Series 8: cor dwi_adc · coronal · 5.0mm · 0.65mm/px · 2 of 40 slices shown]
[im 1/40]
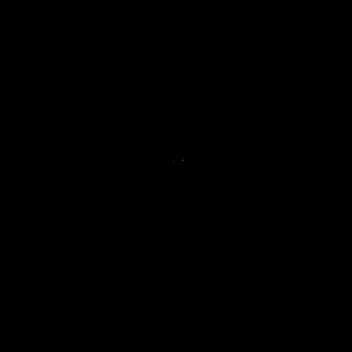
[im 40/40]
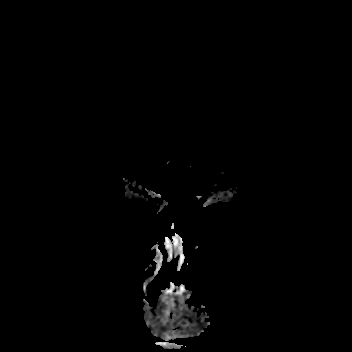

[Series 9: T1 · sagittal · 5.0mm · 0.62mm/px · 1 of 25 slices shown (1 of 2)]
[im 1/25]
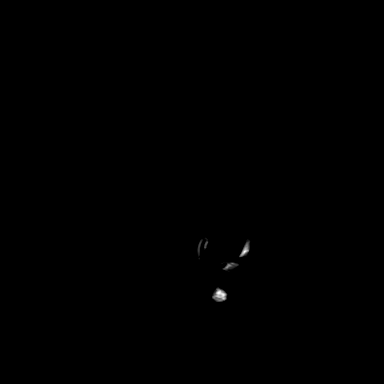

[Series 10: T2 · axial · 5.0mm · 0.53mm/px · 1 of 27 slices shown]
[im 1/27]
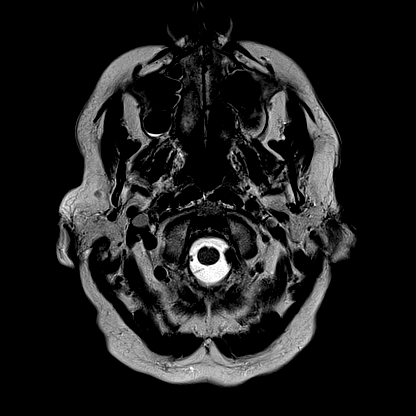

[Series 11: mag_images · axial · 3.0mm · 0.90mm/px · z∈[-89,+88]mm · 3 of 60 slices shown]
[im 1/60]
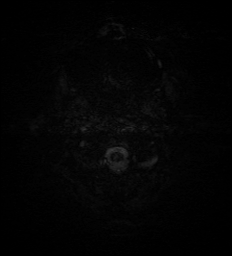
[im 30/60]
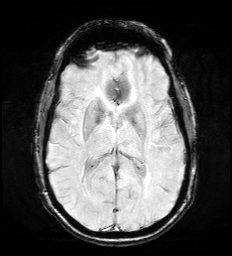
[im 60/60]
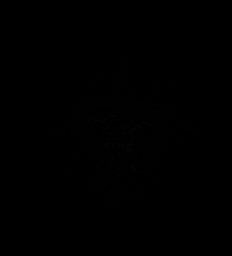

[Series 12: pha_images · axial · 3.0mm · 0.90mm/px · z∈[-89,+82]mm · 3 of 58 slices shown]
[im 1/58]
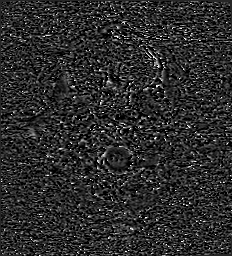
[im 29/58]
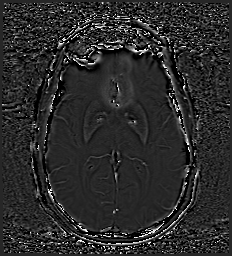
[im 58/58]
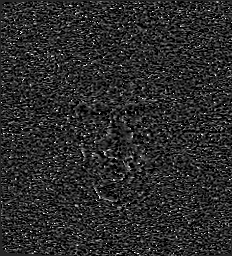

[Series 13: swi_images · axial · 3.0mm · 0.90mm/px · z∈[-89,+88]mm · 3 of 60 slices shown]
[im 1/60]
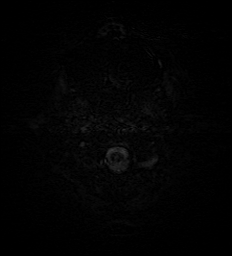
[im 30/60]
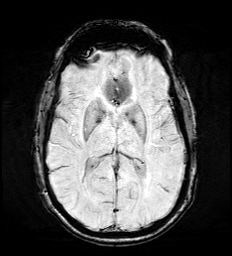
[im 60/60]
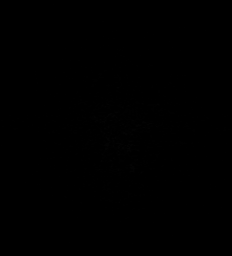

[Series 15: FLAIR · axial · 3.0mm · 0.53mm/px · z∈[-82,+80]mm · 3 of 55 slices shown]
[im 1/55]
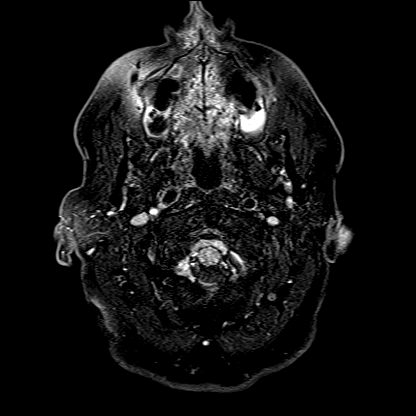
[im 28/55]
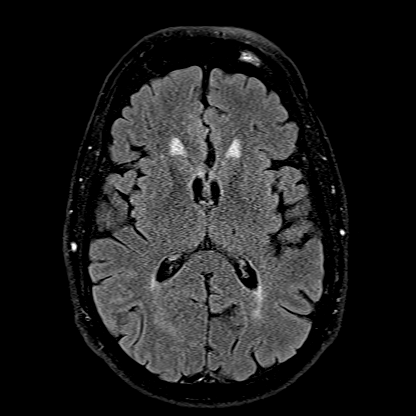
[im 55/55]
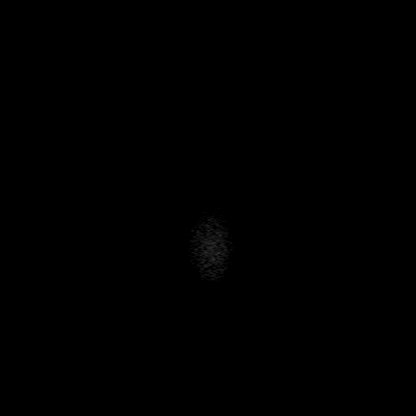

[Series 16: T1 · axial · 1.0mm · 0.98mm/px · z∈[-88,+86]mm · 10 of 176 slices shown (2 of 2)]
[im 1/176]
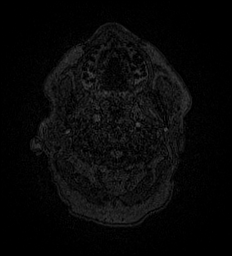
[im 20/176]
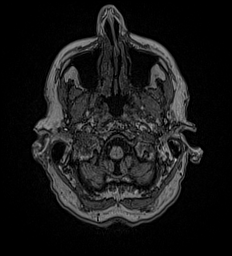
[im 39/176]
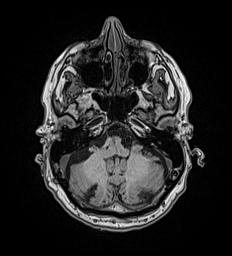
[im 59/176]
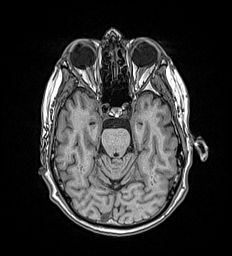
[im 78/176]
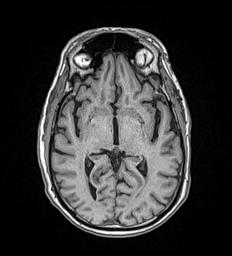
[im 98/176]
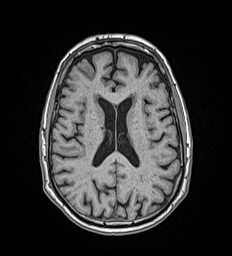
[im 117/176]
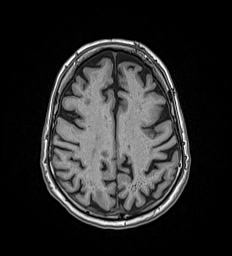
[im 137/176]
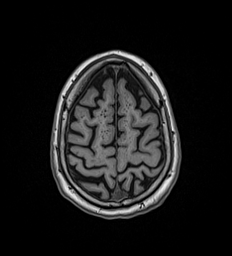
[im 156/176]
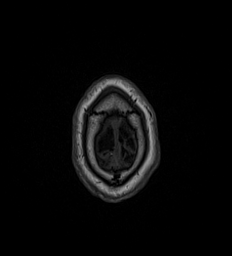
[im 176/176]
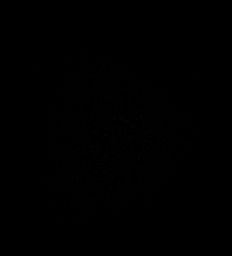

[Series 17: T2 post-contrast · coronal · 5.0mm · 0.57mm/px · 2 of 29 slices shown]
[im 1/29]
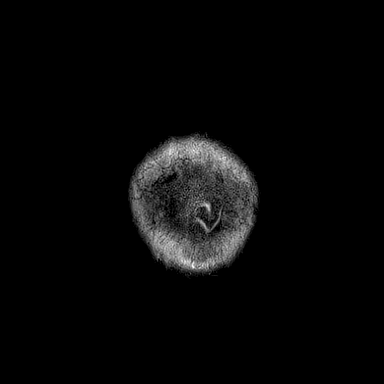
[im 29/29]
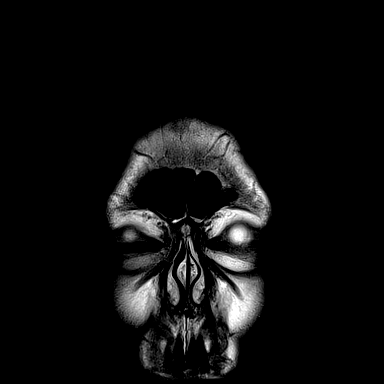

[Series 18: T1 post-contrast · axial · 1.0mm · 0.98mm/px · z∈[-88,+86]mm · 10 of 176 slices shown (1 of 2)]
[im 1/176]
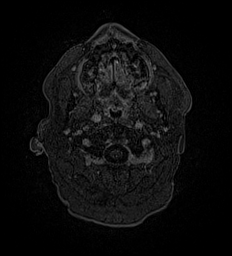
[im 20/176]
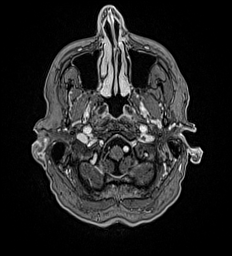
[im 39/176]
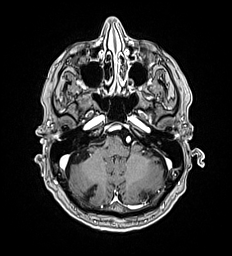
[im 59/176]
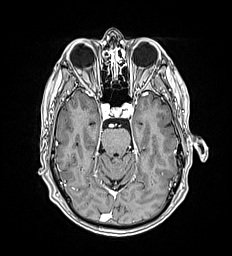
[im 78/176]
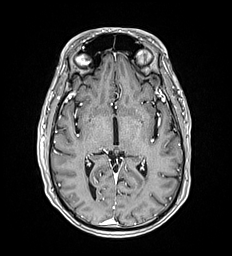
[im 98/176]
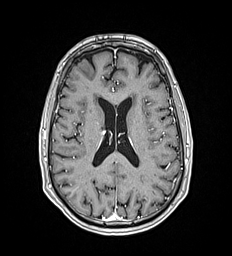
[im 117/176]
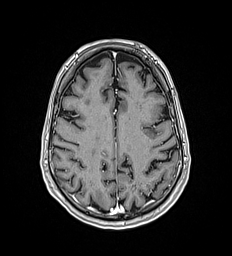
[im 137/176]
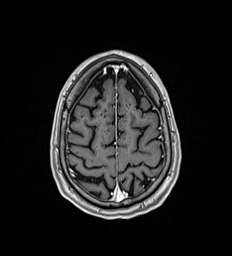
[im 156/176]
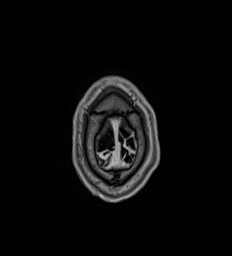
[im 176/176]
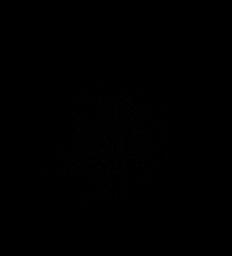

[Series 19: T1 post-contrast · coronal · 5.0mm · 0.57mm/px · 2 of 29 slices shown (2 of 2)]
[im 1/29]
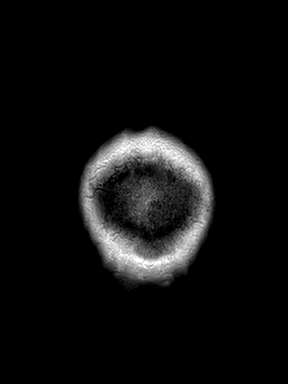
[im 29/29]
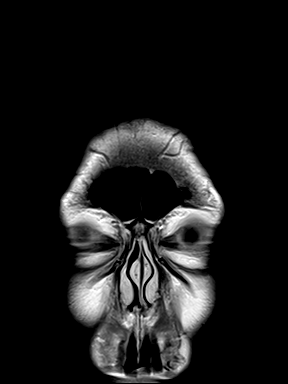

[48 of 48 positions shown; findings below may reference images not displayed]

FINDINGS: Brain: Diffusion imaging does not show any acute or subacute
infarction or other cause of restricted diffusion. Mild chronic
small-vessel change affects the pons. Few old small vessel
cerebellar infarctions. Old small vessel infarction of the left
thalamus. Mild chronic small-vessel ischemic changes of the white
matter. In the right posterior frontal region, there have been
evolutionary changes consistent with late subacute infarction rather
than mass lesion. The areas of abnormal T2 and FLAIR signal have
contracted slightly and there has been mild volume loss. No abnormal
contrast enhancement occurs.

No new interval insult. No hydrocephalus, hemorrhage or extra-axial
collection.

Vascular: Major vessels at the base of the brain show flow.

Skull and upper cervical spine: Normal

Sinuses/Orbits: Clear except for tiny amount of fluid layering in
the maxillary sinuses. Orbits negative.

Other: None
IMPRESSION: 1. Evolutionary changes in the right posterior frontal region
consistent with late subacute infarction.
2. Chronic small-vessel ischemic changes elsewhere throughout the
brain as outlined above.
